# Patient Record
Sex: Female | Born: 1945 | Race: White | Hispanic: No | Marital: Single | Smoking: Current every day smoker
Health system: Southern US, Community
[De-identification: ages and names within clinical notes are randomized; demographics above are authoritative.]

## PROBLEM LIST (undated history)

## (undated) DIAGNOSIS — E876 Hypokalemia: Secondary | ICD-10-CM

## (undated) DIAGNOSIS — S7291XA Unspecified fracture of right femur, initial encounter for closed fracture: Secondary | ICD-10-CM

## (undated) DIAGNOSIS — S72402A Unspecified fracture of lower end of left femur, initial encounter for closed fracture: Secondary | ICD-10-CM

## (undated) DIAGNOSIS — M810 Age-related osteoporosis without current pathological fracture: Secondary | ICD-10-CM

## (undated) DIAGNOSIS — S72462A Displaced supracondylar fracture with intracondylar extension of lower end of left femur, initial encounter for closed fracture: Secondary | ICD-10-CM

## (undated) HISTORY — DX: Displaced supracondylar fracture with intracondylar extension of lower end of left femur, initial encounter for closed fracture: S72.462A

## (undated) HISTORY — DX: Unspecified fracture of right femur, initial encounter for closed fracture: S72.91XA

## (undated) HISTORY — DX: Hypokalemia: E87.6

## (undated) HISTORY — DX: Hypomagnesemia: E83.42

## (undated) HISTORY — DX: Unspecified fracture of lower end of left femur, initial encounter for closed fracture: S72.402A

## (undated) HISTORY — DX: Age-related osteoporosis without current pathological fracture: M81.0

---

## 2004-10-31 ENCOUNTER — Other Ambulatory Visit: Admission: RE | Admit: 2004-10-31 | Discharge: 2004-10-31 | Payer: Self-pay | Admitting: Family Medicine

## 2004-11-21 ENCOUNTER — Encounter: Admission: RE | Admit: 2004-11-21 | Discharge: 2004-11-21 | Payer: Self-pay | Admitting: Family Medicine

## 2005-06-29 ENCOUNTER — Emergency Department (HOSPITAL_COMMUNITY): Admission: EM | Admit: 2005-06-29 | Discharge: 2005-06-29 | Payer: Self-pay | Admitting: Emergency Medicine

## 2006-01-02 ENCOUNTER — Encounter: Admission: RE | Admit: 2006-01-02 | Discharge: 2006-01-02 | Payer: Self-pay | Admitting: Family Medicine

## 2006-02-20 ENCOUNTER — Other Ambulatory Visit: Admission: RE | Admit: 2006-02-20 | Discharge: 2006-02-20 | Payer: Self-pay | Admitting: Family Medicine

## 2006-09-26 ENCOUNTER — Encounter (INDEPENDENT_AMBULATORY_CARE_PROVIDER_SITE_OTHER): Payer: Self-pay | Admitting: Gastroenterology

## 2006-09-26 ENCOUNTER — Ambulatory Visit (HOSPITAL_COMMUNITY): Admission: RE | Admit: 2006-09-26 | Discharge: 2006-09-26 | Payer: Self-pay | Admitting: Gastroenterology

## 2007-01-09 ENCOUNTER — Encounter: Admission: RE | Admit: 2007-01-09 | Discharge: 2007-01-09 | Payer: Self-pay | Admitting: Family Medicine

## 2007-10-30 ENCOUNTER — Other Ambulatory Visit: Admission: RE | Admit: 2007-10-30 | Discharge: 2007-10-30 | Payer: Self-pay | Admitting: Family Medicine

## 2008-01-12 ENCOUNTER — Encounter: Admission: RE | Admit: 2008-01-12 | Discharge: 2008-01-12 | Payer: Self-pay | Admitting: Family Medicine

## 2008-01-14 ENCOUNTER — Encounter: Admission: RE | Admit: 2008-01-14 | Discharge: 2008-01-14 | Payer: Self-pay | Admitting: Family Medicine

## 2008-12-09 ENCOUNTER — Observation Stay (HOSPITAL_COMMUNITY): Admission: AD | Admit: 2008-12-09 | Discharge: 2008-12-10 | Payer: Self-pay | Admitting: Gastroenterology

## 2008-12-10 ENCOUNTER — Encounter (INDEPENDENT_AMBULATORY_CARE_PROVIDER_SITE_OTHER): Payer: Self-pay | Admitting: Gastroenterology

## 2009-01-14 ENCOUNTER — Encounter: Admission: RE | Admit: 2009-01-14 | Discharge: 2009-01-14 | Payer: Self-pay | Admitting: Family Medicine

## 2009-01-21 ENCOUNTER — Encounter: Admission: RE | Admit: 2009-01-21 | Discharge: 2009-01-21 | Payer: Self-pay | Admitting: Family Medicine

## 2010-01-17 ENCOUNTER — Encounter: Admission: RE | Admit: 2010-01-17 | Discharge: 2010-01-17 | Payer: Self-pay | Admitting: Family Medicine

## 2010-06-23 LAB — BASIC METABOLIC PANEL
CO2: 24 mEq/L (ref 19–32)
CO2: 25 mEq/L (ref 19–32)
Calcium: 8.3 mg/dL — ABNORMAL LOW (ref 8.4–10.5)
Calcium: 9 mg/dL (ref 8.4–10.5)
Chloride: 103 mEq/L (ref 96–112)
Creatinine, Ser: 0.51 mg/dL (ref 0.4–1.2)
Creatinine, Ser: 0.52 mg/dL (ref 0.4–1.2)
GFR calc Af Amer: >60 mL/min (ref >60)
GFR calc non Af Amer: >60 mL/min (ref >60)
Glucose, Bld: 93 mg/dL (ref 70–99)
Sodium: 137 mEq/L (ref 135–145)
Sodium: 138 mEq/L (ref 135–145)

## 2010-06-23 LAB — CBC
HCT: 29.1 % — ABNORMAL LOW (ref 36.0–46.0)
HCT: 31 % — ABNORMAL LOW (ref 36.0–46.0)
Hemoglobin: 10.8 g/dL — ABNORMAL LOW (ref 12.0–15.0)
Hemoglobin: 11.3 g/dL — ABNORMAL LOW (ref 12.0–15.0)
Hemoglobin: 9.6 g/dL — ABNORMAL LOW (ref 12.0–15.0)
Hemoglobin: 9.8 g/dL — ABNORMAL LOW (ref 12.0–15.0)
MCHC: 33.1 g/dL (ref 30.0–36.0)
MCHC: 33.9 g/dL (ref 30.0–36.0)
MCHC: 33.9 g/dL (ref 30.0–36.0)
MCHC: 34.3 g/dL (ref 30.0–36.0)
MCV: 103.7 fL — ABNORMAL HIGH (ref 78.0–100.0)
MCV: 103.9 fL — ABNORMAL HIGH (ref 78.0–100.0)
MCV: 104.4 fL — ABNORMAL HIGH (ref 78.0–100.0)
MCV: 105 fL — ABNORMAL HIGH (ref 78.0–100.0)
Platelets: 229 10*3/uL (ref 150–400)
Platelets: 251 10*3/uL (ref 150–400)
RBC: 2.64 MIL/uL — ABNORMAL LOW (ref 3.87–5.11)
RBC: 2.77 MIL/uL — ABNORMAL LOW (ref 3.87–5.11)
RBC: 2.78 MIL/uL — ABNORMAL LOW (ref 3.87–5.11)
RDW: 13 % (ref 11.5–15.5)
RDW: 13.2 % (ref 11.5–15.5)
WBC: 11.1 10*3/uL — ABNORMAL HIGH (ref 4.0–10.5)
WBC: 9.7 10*3/uL (ref 4.0–10.5)

## 2010-08-01 NOTE — Op Note (Signed)
[name], [name]                ACCOUNT NO.:  [id number]   MEDICAL RECORD NO.:  [id number]          PATIENT TYPE:  AMB   LOCATION:  ENDO                         FACILITY:  [facility]   PHYSICIAN:  John C. Madilyn Fireman, M.D.    DATE OF BIRTH:  [birth year]   DATE OF PROCEDURE:  09/26/2006  DATE OF DISCHARGE:                               OPERATIVE REPORT   PROCEDURE:  Colonoscopy with polypectomy.   ENDOSCOPIST:  Everardo All. Madilyn Fireman, M.D.   INDICATIONS FOR PROCEDURE:  Broad adenomatous colon polyp with last  endoscopic resection felt possibly to be incomplete.   PROCEDURE:  The patient placed was placed in the left lateral decubitus  position and placed on pulse monitor with continuous low-flow oxygen  delivered by nasal cannula.  She was sedated with 125 mcg of IV fentanyl  and 12 mg of IV Versed.  The Olympus video colonoscope was inserted into  the rectum and advanced to cecum, confirmed by transillumination of  McBurney's point and visualization of the ileocecal valve and  appendiceal orifice.  The prep was excellent, although there was a lot  of water that had to be suctioned away to allow adequate visualization;  also her colon was very floppy and mobile.  Approximately 10-15 minutes  were spent examining the cecum and the ascending colon.  I saw only 2  small ascending colon polyps, 1 approximately 6 mm in diameter, the  other one 4 mm in diameter.  I could not clearly identify scar tissue  from previous polypectomy and was concerned that these may not have  addressed the original site; however, I carefully examined all the  mucosa and found no other lesions seen in the ascending colon.  The 2  small polyps were retrieved for histology.  The remainder of the cecum,  ascending, transverse, descending and sigmoid colon as well as the  rectum all appeared normal with no further polyps, masses or  diverticula.  The scope was then withdrawn and the patient returned to  the recovery room in stable  condition.  She tolerated the procedure well  and there were no immediate complications.   IMPRESSION:  Two small polyps with no evidence of broadly-attached or  wide-ranging polyp as seen on the last procedure, hopefully indicating  that it was nearly destroyed by a combination of snare and hot biopsy  technique.   PLAN:  Await histology and will probably repeat colonoscopy in a year to  a year and a half.           ______________________________  Everardo All. Madilyn Fireman, M.D.     J[facility]/MEDQ  D:  09/26/2006  T:  09/27/2006  Job:  [id number]   cc:   Sigmund Hazel, M.D.  Fax: [phone]

## 2011-01-17 ENCOUNTER — Other Ambulatory Visit: Payer: Self-pay | Admitting: Gastroenterology

## 2011-02-01 ENCOUNTER — Other Ambulatory Visit: Payer: Self-pay | Admitting: Family Medicine

## 2011-02-01 DIAGNOSIS — Z1231 Encounter for screening mammogram for malignant neoplasm of breast: Secondary | ICD-10-CM

## 2011-02-26 ENCOUNTER — Ambulatory Visit
Admission: RE | Admit: 2011-02-26 | Discharge: 2011-02-26 | Disposition: A | Discharge status: Home / Self Care | Payer: Medicare Other | Source: Ambulatory Visit | Attending: Family Medicine | Admitting: Family Medicine

## 2011-02-26 DIAGNOSIS — Z1231 Encounter for screening mammogram for malignant neoplasm of breast: Secondary | ICD-10-CM

## 2011-09-04 ENCOUNTER — Other Ambulatory Visit: Payer: Self-pay | Admitting: Family Medicine

## 2011-09-04 ENCOUNTER — Ambulatory Visit
Admission: RE | Admit: 2011-09-04 | Discharge: 2011-09-04 | Disposition: A | Discharge status: Home / Self Care | Payer: Medicare Other | Source: Ambulatory Visit | Attending: Family Medicine | Admitting: Family Medicine

## 2011-09-04 DIAGNOSIS — R918 Other nonspecific abnormal finding of lung field: Secondary | ICD-10-CM

## 2012-02-05 ENCOUNTER — Other Ambulatory Visit: Payer: Self-pay | Admitting: Family Medicine

## 2012-02-05 DIAGNOSIS — Z1231 Encounter for screening mammogram for malignant neoplasm of breast: Secondary | ICD-10-CM

## 2012-03-18 ENCOUNTER — Ambulatory Visit
Admission: RE | Admit: 2012-03-18 | Discharge: 2012-03-18 | Disposition: A | Discharge status: Home / Self Care | Payer: Medicare Other | Source: Ambulatory Visit | Attending: Family Medicine | Admitting: Family Medicine

## 2012-03-18 DIAGNOSIS — Z1231 Encounter for screening mammogram for malignant neoplasm of breast: Secondary | ICD-10-CM

## 2013-02-16 ENCOUNTER — Other Ambulatory Visit: Payer: Self-pay

## 2013-02-16 DIAGNOSIS — Z1231 Encounter for screening mammogram for malignant neoplasm of breast: Secondary | ICD-10-CM

## 2013-03-20 ENCOUNTER — Ambulatory Visit
Admission: RE | Admit: 2013-03-20 | Discharge: 2013-03-20 | Disposition: A | Discharge status: Home / Self Care | Payer: Medicare Other | Source: Ambulatory Visit

## 2013-03-20 ENCOUNTER — Other Ambulatory Visit: Payer: Self-pay

## 2013-03-20 DIAGNOSIS — Z1231 Encounter for screening mammogram for malignant neoplasm of breast: Secondary | ICD-10-CM

## 2014-07-05 ENCOUNTER — Other Ambulatory Visit: Payer: Self-pay

## 2014-07-05 DIAGNOSIS — Z1231 Encounter for screening mammogram for malignant neoplasm of breast: Secondary | ICD-10-CM

## 2014-07-08 ENCOUNTER — Ambulatory Visit
Admission: RE | Admit: 2014-07-08 | Discharge: 2014-07-08 | Disposition: A | Discharge status: Home / Self Care | Payer: Medicare Other | Source: Ambulatory Visit

## 2014-07-08 DIAGNOSIS — Z1231 Encounter for screening mammogram for malignant neoplasm of breast: Secondary | ICD-10-CM

## 2015-07-22 ENCOUNTER — Other Ambulatory Visit: Payer: Self-pay

## 2015-07-22 DIAGNOSIS — Z1231 Encounter for screening mammogram for malignant neoplasm of breast: Secondary | ICD-10-CM

## 2015-08-11 ENCOUNTER — Ambulatory Visit
Admission: RE | Admit: 2015-08-11 | Discharge: 2015-08-11 | Disposition: A | Discharge status: Home / Self Care | Payer: Medicare Other | Source: Ambulatory Visit

## 2015-08-11 DIAGNOSIS — Z1231 Encounter for screening mammogram for malignant neoplasm of breast: Secondary | ICD-10-CM

## 2015-08-16 ENCOUNTER — Other Ambulatory Visit: Payer: Self-pay | Admitting: Family Medicine

## 2015-08-16 DIAGNOSIS — R928 Other abnormal and inconclusive findings on diagnostic imaging of breast: Secondary | ICD-10-CM

## 2015-08-24 ENCOUNTER — Other Ambulatory Visit: Payer: Self-pay | Admitting: Family Medicine

## 2015-08-24 DIAGNOSIS — R928 Other abnormal and inconclusive findings on diagnostic imaging of breast: Secondary | ICD-10-CM

## 2015-08-25 ENCOUNTER — Ambulatory Visit
Admission: RE | Admit: 2015-08-25 | Discharge: 2015-08-25 | Disposition: A | Discharge status: Home / Self Care | Payer: Medicare Other | Source: Ambulatory Visit | Attending: Family Medicine | Admitting: Family Medicine

## 2015-08-25 ENCOUNTER — Other Ambulatory Visit: Payer: Self-pay | Admitting: Family Medicine

## 2015-08-25 DIAGNOSIS — R928 Other abnormal and inconclusive findings on diagnostic imaging of breast: Secondary | ICD-10-CM

## 2015-08-25 DIAGNOSIS — N6320 Unspecified lump in the left breast, unspecified quadrant: Secondary | ICD-10-CM

## 2015-08-31 ENCOUNTER — Ambulatory Visit
Admission: RE | Admit: 2015-08-31 | Discharge: 2015-08-31 | Disposition: A | Discharge status: Home / Self Care | Payer: Medicare Other | Source: Ambulatory Visit | Attending: Family Medicine | Admitting: Family Medicine

## 2015-08-31 ENCOUNTER — Other Ambulatory Visit: Payer: Self-pay | Admitting: Family Medicine

## 2015-08-31 DIAGNOSIS — N6320 Unspecified lump in the left breast, unspecified quadrant: Secondary | ICD-10-CM

## 2015-12-31 ENCOUNTER — Inpatient Hospital Stay (HOSPITAL_COMMUNITY)
Admission: EM | Admit: 2015-12-31 | Discharge: 2016-01-06 | DRG: 482 | Disposition: A | Discharge status: Skilled Nursing Facility | Payer: Medicare Other | Attending: Orthopedic Surgery | Admitting: Orthopedic Surgery

## 2015-12-31 ENCOUNTER — Encounter (HOSPITAL_COMMUNITY): Payer: Self-pay | Admitting: Emergency Medicine

## 2015-12-31 ENCOUNTER — Emergency Department (HOSPITAL_COMMUNITY): Payer: Medicare Other

## 2015-12-31 DIAGNOSIS — W108XXA Fall (on) (from) other stairs and steps, initial encounter: Secondary | ICD-10-CM | POA: Diagnosis present

## 2015-12-31 DIAGNOSIS — S7291XS Unspecified fracture of right femur, sequela: Secondary | ICD-10-CM | POA: Diagnosis not present

## 2015-12-31 DIAGNOSIS — F17200 Nicotine dependence, unspecified, uncomplicated: Secondary | ICD-10-CM | POA: Diagnosis present

## 2015-12-31 DIAGNOSIS — S72402A Unspecified fracture of lower end of left femur, initial encounter for closed fracture: Secondary | ICD-10-CM

## 2015-12-31 DIAGNOSIS — S7291XA Unspecified fracture of right femur, initial encounter for closed fracture: Secondary | ICD-10-CM | POA: Diagnosis present

## 2015-12-31 DIAGNOSIS — R9431 Abnormal electrocardiogram [ECG] [EKG]: Secondary | ICD-10-CM | POA: Diagnosis present

## 2015-12-31 DIAGNOSIS — M25569 Pain in unspecified knee: Secondary | ICD-10-CM | POA: Diagnosis present

## 2015-12-31 DIAGNOSIS — Z7983 Long term (current) use of bisphosphonates: Secondary | ICD-10-CM | POA: Diagnosis not present

## 2015-12-31 DIAGNOSIS — M810 Age-related osteoporosis without current pathological fracture: Secondary | ICD-10-CM | POA: Diagnosis present

## 2015-12-31 DIAGNOSIS — Z09 Encounter for follow-up examination after completed treatment for conditions other than malignant neoplasm: Secondary | ICD-10-CM

## 2015-12-31 DIAGNOSIS — E876 Hypokalemia: Secondary | ICD-10-CM | POA: Diagnosis present

## 2015-12-31 DIAGNOSIS — Y92019 Unspecified place in single-family (private) house as the place of occurrence of the external cause: Secondary | ICD-10-CM

## 2015-12-31 DIAGNOSIS — Z01811 Encounter for preprocedural respiratory examination: Secondary | ICD-10-CM

## 2015-12-31 DIAGNOSIS — Z419 Encounter for procedure for purposes other than remedying health state, unspecified: Secondary | ICD-10-CM

## 2015-12-31 DIAGNOSIS — Y9301 Activity, walking, marching and hiking: Secondary | ICD-10-CM | POA: Diagnosis present

## 2015-12-31 DIAGNOSIS — S72462A Displaced supracondylar fracture with intracondylar extension of lower end of left femur, initial encounter for closed fracture: Secondary | ICD-10-CM | POA: Diagnosis present

## 2015-12-31 LAB — PROTIME-INR
INR: 0.9
Prothrombin Time: 12.1 seconds (ref 11.4–15.2)

## 2015-12-31 LAB — CBC WITH DIFFERENTIAL/PLATELET
Basophils Absolute: 0 K/uL (ref 0.0–0.1)
Basophils Relative: 0 %
Eosinophils Absolute: 0 K/uL (ref 0.0–0.7)
Eosinophils Relative: 0 %
HCT: 39.2 % (ref 36.0–46.0)
Hemoglobin: 13.6 g/dL (ref 12.0–15.0)
Lymphocytes Relative: 13 %
Lymphs Abs: 1.1 K/uL (ref 0.7–4.0)
MCH: 34.7 pg — ABNORMAL HIGH (ref 26.0–34.0)
MCHC: 34.7 g/dL (ref 30.0–36.0)
MCV: 100 fL (ref 78.0–100.0)
Monocytes Absolute: 0.5 K/uL (ref 0.1–1.0)
Monocytes Relative: 6 %
Neutro Abs: 7.1 K/uL (ref 1.7–7.7)
Neutrophils Relative %: 81 %
Platelets: 290 K/uL (ref 150–400)
RBC: 3.92 MIL/uL (ref 3.87–5.11)
RDW: 12.8 % (ref 11.5–15.5)
WBC: 8.8 K/uL (ref 4.0–10.5)

## 2015-12-31 LAB — URINALYSIS, ROUTINE W REFLEX MICROSCOPIC
GLUCOSE, UA: NEGATIVE mg/dL
Hgb urine dipstick: NEGATIVE
Ketones, ur: 15 mg/dL — AB
LEUKOCYTES UA: NEGATIVE
NITRITE: NEGATIVE
PH: 6 (ref 5.0–8.0)
Protein, ur: NEGATIVE mg/dL
SPECIFIC GRAVITY, URINE: 1.022 (ref 1.005–1.030)

## 2015-12-31 LAB — BASIC METABOLIC PANEL WITH GFR
Anion gap: 10 (ref 5–15)
BUN: 12 mg/dL (ref 6–20)
CO2: 20 mmol/L — ABNORMAL LOW (ref 22–32)
Calcium: 9.6 mg/dL (ref 8.9–10.3)
Chloride: 106 mmol/L (ref 101–111)
Creatinine, Ser: 0.62 mg/dL (ref 0.44–1.00)
GFR calc Af Amer: >60 mL/min
GFR calc non Af Amer: >60 mL/min
Glucose, Bld: 99 mg/dL (ref 65–99)
Potassium: 2.9 mmol/L — ABNORMAL LOW (ref 3.5–5.1)
Sodium: 136 mmol/L (ref 135–145)

## 2015-12-31 MED ORDER — MIDAZOLAM HCL 2 MG/2ML IJ SOLN
INTRAMUSCULAR | Status: AC | PRN
  Administered 2015-12-31: 0.5 mg via INTRAVENOUS

## 2015-12-31 MED ORDER — METHOCARBAMOL 500 MG PO TABS
500.0000 mg | ORAL_TABLET | Freq: Four times a day (QID) | ORAL | Status: DC | PRN
Start: 2015-12-31 — End: 2016-01-04
  Administered 2016-01-01 – 2016-01-04 (×4): 500 mg via ORAL
  Filled 2015-12-31 (×4): qty 1

## 2015-12-31 MED ORDER — FENTANYL CITRATE (PF) 100 MCG/2ML IJ SOLN
50.0000 ug | Freq: Once | INTRAMUSCULAR | Status: AC
  Administered 2015-12-31: 50 ug via INTRAVENOUS
  Filled 2015-12-31: qty 2

## 2015-12-31 MED ORDER — ENOXAPARIN SODIUM 40 MG/0.4ML ~~LOC~~ SOLN
40.0000 mg | SUBCUTANEOUS | Status: DC
  Administered 2015-12-31: 40 mg via SUBCUTANEOUS
  Filled 2015-12-31: qty 0.4

## 2015-12-31 MED ORDER — HYDROMORPHONE HCL 1 MG/ML IJ SOLN
1.0000 mg | Freq: Once | INTRAMUSCULAR | Status: AC
  Administered 2015-12-31: 1 mg via INTRAVENOUS
  Filled 2015-12-31: qty 1

## 2015-12-31 MED ORDER — ETOMIDATE 2 MG/ML IV SOLN
10.0000 mg | Freq: Once | INTRAVENOUS | Status: DC
  Filled 2015-12-31: qty 10

## 2015-12-31 MED ORDER — METHOCARBAMOL 1000 MG/10ML IJ SOLN
500.0000 mg | Freq: Four times a day (QID) | INTRAVENOUS | Status: DC | PRN
  Filled 2015-12-31: qty 5

## 2015-12-31 MED ORDER — MIDAZOLAM HCL 2 MG/2ML IJ SOLN
INTRAMUSCULAR | Status: AC
  Filled 2015-12-31: qty 2

## 2015-12-31 MED ORDER — OXYCODONE HCL 5 MG PO TABS
5.0000 mg | ORAL_TABLET | ORAL | Status: DC | PRN
  Administered 2016-01-01 (×2): 10 mg via ORAL
  Administered 2016-01-03: 5 mg via ORAL
  Administered 2016-01-04: 10 mg via ORAL
  Administered 2016-01-04 (×2): 5 mg via ORAL
  Administered 2016-01-05 (×2): 10 mg via ORAL
  Filled 2015-12-31 (×2): qty 2
  Filled 2015-12-31: qty 1
  Filled 2015-12-31: qty 2
  Filled 2015-12-31: qty 1
  Filled 2015-12-31 (×4): qty 2

## 2015-12-31 MED ORDER — POTASSIUM CHLORIDE 10 MEQ/100ML IV SOLN
10.0000 meq | Freq: Once | INTRAVENOUS | Status: AC
  Administered 2015-12-31: 10 meq via INTRAVENOUS
  Filled 2015-12-31: qty 100

## 2015-12-31 MED ORDER — OXYCODONE-ACETAMINOPHEN 5-325 MG PO TABS
1.0000 | ORAL_TABLET | Freq: Once | ORAL | Status: AC
  Administered 2015-12-31: 1 via ORAL
  Filled 2015-12-31: qty 1

## 2015-12-31 MED ORDER — HYDROMORPHONE HCL 1 MG/ML IJ SOLN
0.5000 mg | INTRAMUSCULAR | Status: DC | PRN
  Administered 2015-12-31 – 2016-01-02 (×5): 0.5 mg via INTRAVENOUS
  Filled 2015-12-31 (×5): qty 1

## 2015-12-31 MED ORDER — ETOMIDATE 2 MG/ML IV SOLN
INTRAVENOUS | Status: AC | PRN
  Administered 2015-12-31: 10 mg via INTRAVENOUS

## 2015-12-31 MED ORDER — MIDAZOLAM HCL 2 MG/2ML IJ SOLN: 0.5000 mg | Freq: Once | INTRAMUSCULAR | Status: DC

## 2015-12-31 MED ORDER — LACTATED RINGERS IV SOLN
INTRAVENOUS | Status: DC
  Administered 2015-12-31 – 2016-01-03 (×5): via INTRAVENOUS

## 2015-12-31 NOTE — Consult Note (Signed)
History and Physical    [name] ZOX:[id number] DOB: 1946/01/12 DOA: 12/31/2015  PCP: Carson Myrtle, MD  Patient coming from: home  Chief Complaint:   Fall Reason for consult:  Pre op evaluation Consulting physician:  Dr. Shon Baton orthopedic surgeon  HPI: [name] is a 70 y.o. female with medical history significant of osteoporosis comes in after slipping on her stairs walking down and hurt her left leg.  Pt is overall very healthy.  Sees her PCP regularly twice a year.  There was no LOC during fall no other injury.  Found to have a distal left femur fracture.  Dr Shon Baton was called with ortho team who has just reduced the fracture and called for medical consult for preop evaluation, he plans to take to OR Monday.  Review of Systems: As per HPI otherwise 10 point review of systems negative.   History reviewed. No pertinent past medical history.  History reviewed. No pertinent surgical history.   reports that she has been smoking.  She does not have any smokeless tobacco history on file. She reports that she does not drink alcohol. Her drug history is not on file. neg x 3  No Known Allergies  History reviewed. No pertinent family history.  Prior to Admission medications   Medication Sig Start Date End Date Taking? Authorizing Provider  alendronate (FOSAMAX) 70 MG tablet Take 70 mg by mouth once a week. Sundays 11/22/15  Yes Historical Provider, MD  Multiple Vitamins-Minerals (MULTIVITAMIN ADULT PO) Take 1 tablet by mouth daily.    Yes Historical Provider, MD    Physical Exam: Vitals:   12/31/15 1933 12/31/15 1946 12/31/15 1951 12/31/15 2000  BP: 144/80 148/83 134/87 136/83  Pulse: 98 89 84 83  Resp: 17 19 18 16   Temp:      TempSrc:      SpO2: 95% 95% 100% 99%      Constitutional: NAD, calm, comfortable Vitals:   12/31/15 1933 12/31/15 1946 12/31/15 1951 12/31/15 2000  BP: 144/80 148/83 134/87 136/83  Pulse: 98 89 84 83  Resp: 17 19 18 16   Temp:      TempSrc:       SpO2: 95% 95% 100% 99%   Eyes: PERRL, lids and conjunctivae normal ENMT: Mucous membranes are moist. Posterior pharynx clear of any exudate or lesions.Normal dentition.  Neck: normal, supple, no masses, no thyromegaly Respiratory: clear to auscultation bilaterally, no wheezing, no crackles. Normal respiratory effort. No accessory muscle use.  Cardiovascular: Regular rate and rhythm, no murmurs / rubs / gallops. No extremity edema. 2+ pedal pulses. No carotid bruits.  Abdomen: no tenderness, no masses palpated. No hepatosplenomegaly. Bowel sounds positive.  Musculoskeletal: no clubbing / cyanosis. No joint deformity upper and lower extremities. Good ROM, no contractures. Normal muscle tone.  lle in brace, pulse intact Skin: no rashes, lesions, ulcers. No induration Neurologic: CN 2-12 grossly intact. Sensation intact, DTR normal. Strength 5/5 in all 4.  Psychiatric: Normal judgment and insight. Alert and oriented x 3. Normal mood.    Labs on Admission: I have personally reviewed following labs and imaging studies  CBC:  Recent Labs Lab 12/31/15 1625  WBC 8.8  NEUTROABS 7.1  HGB 13.6  HCT 39.2  MCV 100.0  PLT 290   Basic Metabolic Panel:  Recent Labs Lab 12/31/15 1625  NA 136  K 2.9*  CL 106  CO2 20*  GLUCOSE 99  BUN 12  CREATININE 0.62  CALCIUM 9.6   GFR: CrCl cannot be calculated (  Unknown ideal weight.).  Coagulation Profile:  Recent Labs Lab 12/31/15 1625  INR 0.90     Radiological Exams on Admission: Ct Knee Left Wo Contrast  Result Date: 12/31/2015 CLINICAL DATA:  Evaluate left knee fracture.  Initial encounter. EXAM: CT OF THE LEFT KNEE WITHOUT CONTRAST TECHNIQUE: Multidetector CT imaging of the left knee was performed according to the standard protocol. Multiplanar CT image reconstructions were also generated. COMPARISON:  Left knee radiographs performed earlier today at 3:11 p.m. FINDINGS: Bones/Joint/Cartilage There is a comminuted and displaced  fracture of the distal femur, with a large posteriorly, proximally and laterally displaced lateral condylar fragment, and an additional mildly displaced fracture line extending across the medial femoral metadiaphysis. The larger medial condylar fragment demonstrates mild posterior displacement and angulation. No additional fractures are seen. There is diffuse chondrocalcinosis at the joint space, and moderate hemarthrosis is noted, with blood tracking throughout Hoffa's fat pad. The cartilage is not well assessed on CT. Ligaments The posterior cruciate ligament appears intact. The anterior cruciate ligament is not well assessed on CT. The medial collateral ligament and lateral collateral ligament complex are difficult to fully assess. Muscles and Tendons The visualized musculature is grossly unremarkable. The quadriceps and patellar tendons remain intact. Soft tissues The vasculature is not well assessed. No additional soft tissue abnormalities are seen. IMPRESSION: 1. Comminuted and displaced fracture of the distal femur, with a large posteriorly, proximally and laterally displaced lateral condylar fragment, and additional mildly displaced fracture line extending across the medial femoral metadiaphysis. The larger medial condylar fragment demonstrates mild posterior displacement and angulation. 2. Moderate hemarthrosis, with blood tracking throughout Hoffa's fat pad. 3. Diffuse chondrocalcinosis at the joint space. Electronically Signed   By: Roanna Raider M.D.   On: 12/31/2015 18:46   Dg Knee Complete 4 Views Left  Result Date: 12/31/2015 CLINICAL DATA:  Pt c/o severe knee pain today w/ obv deformity s/p slipped on bottom two steps at her son's house today, landing on knee. EXAM: LEFT KNEE - COMPLETE 4+ VIEW COMPARISON:  None. FINDINGS: Significantly displaced fracture of the distal left femur, with avulsion of a large portion of the lateral femoral condyle, with probable additional impaction at the fracture  site. Suspect additional minimally displaced fracture within the medial femoral condyle. No obvious fracture seen within the proximal tibia or fibula. IMPRESSION: 1. Significantly displaced fracture of the distal left femur, with avulsion and rotation of a large portion of the lateral femoral condyle, with probable additional impaction at the fracture site. 2. Suspect additional minimally displaced fracture within the medial femoral condyle. Electronically Signed   By: Bary Richard M.D.   On: 12/31/2015 15:37    EKG: Independently reviewed. nsr q waves noted in anterior leads, no acute issues  Assessment/Plan 70 yo healthy female with distal right femur fracture  Principal Problem:   Hypokalemia- replete k level orally, add on magnesium level and correct as needed prior to surgery.  Active Problems:   Femur fracture, right (HCC)- anticoagulants per ortho team   Osteoporosis- noted, hold weekly fosamax   Abnormal EKG - will obtain cardiac echo prior to OR since she is going on Monday   DVT prophylaxis:  Per ortho team Code Status:   full Family Communication: son at bedside Disposition Plan:  Per ortho team, may need rehab post op depending on how she does, lives alone Consults called:  none Admission status:  Admit by ortho team  Please call us with any further questions, we will follow along  with you daily to optimize her perioperative course.  Thank you and have a good day.   DAVID,RACHAL A MD [facility]  If 7PM-7AM, please contact night-coverage [email or web address] Password [email or web address]  12/31/2015, 8:32 PM

## 2015-12-31 NOTE — ED Notes (Signed)
Blood delayed due to staff is doing a conscious sedation at this time

## 2015-12-31 NOTE — ED Provider Notes (Addendum)
[facility]-EMERGENCY DEPT Provider Note   CSN: [id number] Arrival date & time: 12/31/15  1429     History   Chief Complaint Chief Complaint  Patient presents with  . Fall  . Knee Pain    HPI [name] is a 70 y.o. female with no significant pmhx who presents to the ED today to be evaluated after a mechanical fall. Pt states that she was walking down the stairs when her foot slipped on the 2nd to last stair and she fell forward landing on her right knee. She denies and head injury, LOC, other trauma. Pt has been unable to straighten left leg or ambulate since accident. Injury occurred approximately 6 hours ago. Pt does not take blood thinners.   HPI  History reviewed. No pertinent past medical history.  There are no active problems to display for this patient.   History reviewed. No pertinent surgical history.  OB History    No data available       Home Medications    Prior to Admission medications   Medication Sig Start Date End Date Taking? Authorizing Provider  alendronate (FOSAMAX) 70 MG tablet Take 70 mg by mouth once a week.  11/22/15   Historical Provider, MD    Family History History reviewed. No pertinent family history.  Social History Social History  Substance Use Topics  . Smoking status: Current Every Day Smoker  . Smokeless tobacco: Not on file  . Alcohol use No     Allergies   Review of patient's allergies indicates no known allergies.   Review of Systems Review of Systems  All other systems reviewed and are negative.    Physical Exam Updated Vital Signs BP 105/75 (BP Location: Left Arm)   Pulse 89   Temp 97.8 F (36.6 C) (Oral)   Resp 18   SpO2 100%   Physical Exam  Constitutional: She is oriented to person, place, and time. She appears well-developed and well-nourished. No distress.  HENT:  Head: Normocephalic and atraumatic.  Eyes: Conjunctivae are normal. Right eye exhibits no discharge. Left eye exhibits no discharge. No  scleral icterus.  Cardiovascular: Normal rate and intact distal pulses.   Pulmonary/Chest: Effort normal.  Musculoskeletal:  Significant swelling of left knee, flexed in 45 degree position with lateral rotation, unable to extend fully due to pain. Severe TTP.   Neurological: She is alert and oriented to person, place, and time. Coordination normal.  Skin: Skin is warm and dry. No rash noted. She is not diaphoretic. No erythema. No pallor.  Psychiatric: She has a normal mood and affect. Her behavior is normal.  Nursing note and vitals reviewed.    ED Treatments / Results  Labs (all labs ordered are listed, but only abnormal results are displayed) Labs Reviewed  BASIC METABOLIC PANEL  CBC WITH DIFFERENTIAL/PLATELET    EKG  EKG Interpretation None       Radiology Dg Knee Complete 4 Views Left  Result Date: 12/31/2015 CLINICAL DATA:  Pt c/o severe knee pain today w/ obv deformity s/p slipped on bottom two steps at her son's house today, landing on knee. EXAM: LEFT KNEE - COMPLETE 4+ VIEW COMPARISON:  None. FINDINGS: Significantly displaced fracture of the distal left femur, with avulsion of a large portion of the lateral femoral condyle, with probable additional impaction at the fracture site. Suspect additional minimally displaced fracture within the medial femoral condyle. No obvious fracture seen within the proximal tibia or fibula. IMPRESSION: 1. Significantly displaced fracture of the distal  left femur, with avulsion and rotation of a large portion of the lateral femoral condyle, with probable additional impaction at the fracture site. 2. Suspect additional minimally displaced fracture within the medial femoral condyle. Electronically Signed   By: Bary Richard M.D.   On: 12/31/2015 15:37    Procedures .Sedation Date/Time: 12/31/2015 8:29 PM Performed by: Gaylyn Rong TRIPP Authorized by: Gaylyn Rong TRIPP   Consent:    Consent obtained:  Verbal   Consent given  by:  Patient   Risks discussed:  Allergic reaction, respiratory compromise necessitating ventilatory assistance and intubation and inadequate sedation   Alternatives discussed:  Analgesia without sedation Indications:    Procedure performed:  Fracture reduction   Procedure necessitating sedation performed by:  Physician performing sedation   Intended level of sedation:  Moderate (conscious sedation) Pre-sedation assessment:    ASA classification: class 1 - normal, healthy patient     Neck mobility: normal     Mouth opening:  3 or more finger widths   Pre-sedation assessments completed and reviewed: airway patency, mental status, pain level and respiratory function     History of difficult intubation: no   Immediate pre-procedure details:    Reassessment: Patient reassessed immediately prior to procedure     Reviewed: vital signs and NPO status     Verified: emergency equipment available, IV patency confirmed, oxygen available and suction available   Procedure details (see MAR for exact dosages):    Preoxygenation:  Room air   Sedation:  Etomidate (and versed)   Analgesia:  Hydromorphone   Intra-procedure monitoring:  Blood pressure monitoring, cardiac monitor, continuous capnometry, continuous pulse oximetry, frequent vital sign checks and frequent LOC assessments   Intra-procedure events: hypoxia     Intra-procedure management:  Airway repositioning and supplemental oxygen Post-procedure details:    Attendance: Constant attendance by certified staff until patient recovered     Recovery: Patient returned to pre-procedure baseline     Post-sedation assessments completed and reviewed: airway patency, cardiovascular function, mental status, nausea/vomiting, pain level and temperature     Patient is stable for discharge or admission: yes     Patient tolerance:  Tolerated well, no immediate complications Reduction of fracture Date/Time: 12/31/2015 8:33 PM Performed by: Gaylyn Rong  TRIPP Authorized by: Gaylyn Rong TRIPP  Consent: Verbal consent obtained. Risks and benefits: risks, benefits and alternatives were discussed Consent given by: patient Patient understanding: patient states understanding of the procedure being performed Patient consent: the patient's understanding of the procedure matches consent given Procedure consent: procedure consent matches procedure scheduled Relevant documents: relevant documents present and verified Test results: test results available and properly labeled Site marked: the operative site was marked Imaging studies: imaging studies available Required items: required blood products, implants, devices, and special equipment available Patient identity confirmed: verbally with patient Time out: Immediately prior to procedure a "time out" was called to verify the correct patient, procedure, equipment, support staff and site/side marked as required. Local anesthesia used: no  Anesthesia: Local anesthesia used: no  Sedation: Patient sedated: yes Sedatives: etomidate and midazolam Analgesia: hydromorphone Vitals: Vital signs were monitored during sedation. Patient tolerance: Patient tolerated the procedure well with no immediate complications       Medications Ordered in ED Medications  fentaNYL (SUBLIMAZE) injection 50 mcg (not administered)  oxyCODONE-acetaminophen (PERCOCET/ROXICET) 5-325 MG per tablet 1 tablet (1 tablet Oral Given 12/31/15 1612)     Initial Impression / Assessment and Plan / ED Course  I have reviewed the triage vital  signs and the nursing notes.  Pertinent labs & imaging results that were available during my care of the patient were reviewed by me and considered in my medical decision making (see chart for details).  Clinical Course   70 year old female with no significant past medical history presents to the ED today to evaluate after a fall onto left knee. X-ray reveals a significantly  displaced distal femur fracture. Patient is neurovascularly intact. No other trauma or injury noted. Patient is not anticoagulated. 6:29 PM Spoke with Dr. Shon Baton with orthopedics who will consult pt in ED.   Dr. Shon Baton plans to admit patient to [facility]. Patient was consciously sedated for fracture reduction and placed in the immobilizer. We'll arrange for transfer to [facility].  Patient was discussed with and seen by Dr. Particia Nearing who agrees with the treatment plan.    Final Clinical Impressions(s) / ED Diagnoses   Final diagnoses:  Closed fracture of distal end of left femur, unspecified fracture morphology, initial encounter [facility])    New Prescriptions New Prescriptions   No medications on file     Dub Mikes, PA-C 12/31/15 2056    Jacalyn Lefevre, MD 12/31/15 2336    Dub Mikes, PA-C 02/11/16 1035    Jacalyn Lefevre, MD 02/11/16 1428

## 2015-12-31 NOTE — ED Triage Notes (Signed)
Pt tripped on 2 steps this am and injured L knee. L knee swollen. Pt has knee bent in wheelchair. Has been unable to bear weight on knee. Denies any other injury. Not on blood thinners.

## 2015-12-31 NOTE — H&P (Signed)
MILLER, Haywood PaoLISA NACZKI, MD Chief Complaint: S/p fall History: Heidi RossettiKarin Boyd is a 70 y.o. female with no significant pmhx who presents to the ED today to be evaluated after a mechanical fall. Pt states that she was walking down the stairs when her foot slipped on the 2nd to last stair and she fell forward landing on her right knee. She denies and head injury, LOC, other trauma. Pt has been unable to straighten left leg or ambulate since accident. Injury occurred approximately 6 hours ago. Pt does not take blood thinners.  History reviewed. No pertinent past medical history.  No Known Allergies  No current facility-administered medications on file prior to encounter.    No current outpatient prescriptions on file prior to encounter.    Physical Exam: Vitals:   12/31/15 1933 12/31/15 1933  BP: 144/80 144/80  Pulse: 96 98  Resp: 20 17  Temp: 98 F (36.7 C)    A+O x3 2+ DP/PT pulses Compartments soft/NT No SOB/CP   Lungs: CTA Abd soft/NT No pain/deformity of UE Left LE:  Knee flexed and swollen.  Tender to palpation.  NO laceration/abrasion EHL/TA/GA 5/5 Cap refill<2 sec  Image: Ct Knee Left Wo Contrast  Result Date: 12/31/2015 CLINICAL DATA:  Evaluate left knee fracture.  Initial encounter. EXAM: CT OF THE LEFT KNEE WITHOUT CONTRAST TECHNIQUE: Multidetector CT imaging of the left knee was performed according to the standard protocol. Multiplanar CT image reconstructions were also generated. COMPARISON:  Left knee radiographs performed earlier today at 3:11 p.m. FINDINGS: Bones/Joint/Cartilage There is a comminuted and displaced fracture of the distal femur, with a large posteriorly, proximally and laterally displaced lateral condylar fragment, and an additional mildly displaced fracture line extending across the medial femoral metadiaphysis. The larger medial condylar fragment demonstrates mild posterior displacement and angulation. No additional fractures are seen. There is diffuse  chondrocalcinosis at the joint space, and moderate hemarthrosis is noted, with blood tracking throughout Hoffa's fat pad. The cartilage is not well assessed on CT. Ligaments The posterior cruciate ligament appears intact. The anterior cruciate ligament is not well assessed on CT. The medial collateral ligament and lateral collateral ligament complex are difficult to fully assess. Muscles and Tendons The visualized musculature is grossly unremarkable. The quadriceps and patellar tendons remain intact. Soft tissues The vasculature is not well assessed. No additional soft tissue abnormalities are seen. IMPRESSION: 1. Comminuted and displaced fracture of the distal femur, with a large posteriorly, proximally and laterally displaced lateral condylar fragment, and additional mildly displaced fracture line extending across the medial femoral metadiaphysis. The larger medial condylar fragment demonstrates mild posterior displacement and angulation. 2. Moderate hemarthrosis, with blood tracking throughout Hoffa's fat pad. 3. Diffuse chondrocalcinosis at the joint space. Electronically Signed   By: Roanna RaiderJeffery  Chang M.D.   On: 12/31/2015 18:46   Dg Knee Complete 4 Views Left  Result Date: 12/31/2015 CLINICAL DATA:  Pt c/o severe knee pain today w/ obv deformity s/p slipped on bottom two steps at her son's house today, landing on knee. EXAM: LEFT KNEE - COMPLETE 4+ VIEW COMPARISON:  None. FINDINGS: Significantly displaced fracture of the distal left femur, with avulsion of a large portion of the lateral femoral condyle, with probable additional impaction at the fracture site. Suspect additional minimally displaced fracture within the medial femoral condyle. No obvious fracture seen within the proximal tibia or fibula. IMPRESSION: 1. Significantly displaced fracture of the distal left femur, with avulsion and rotation of a large portion of the lateral femoral condyle, with probable  additional impaction at the fracture site.  2. Suspect additional minimally displaced fracture within the medial femoral condyle. Electronically Signed   By: Bary Richard M.D.   On: 12/31/2015 15:37    A/P: Patient s/p fall with closed comminuted distal left femur fracture.  Patient otherwise healthy. Underwent sedation for gentle closed reduction and application of knee immobilizer in the ER Clinical exam stable - no evidence of compartment syndrome, or NV injury Plan on transfer to [facility] for definitive fracture management Monday. Spoke with Dr Linna Caprice - he will take over care and preform ORIF.

## 2016-01-01 ENCOUNTER — Inpatient Hospital Stay (HOSPITAL_COMMUNITY): Payer: Medicare Other

## 2016-01-01 LAB — CBC
HCT: 38.5 % (ref 36.0–46.0)
Hemoglobin: 13 g/dL (ref 12.0–15.0)
MCH: 33.9 pg (ref 26.0–34.0)
MCHC: 33.8 g/dL (ref 30.0–36.0)
MCV: 100.3 fL — AB (ref 78.0–100.0)
PLATELETS: 239 10*3/uL (ref 150–400)
RBC: 3.84 MIL/uL — ABNORMAL LOW (ref 3.87–5.11)
RDW: 12.9 % (ref 11.5–15.5)
WBC: 11 10*3/uL — ABNORMAL HIGH (ref 4.0–10.5)

## 2016-01-01 LAB — BASIC METABOLIC PANEL
Anion gap: 12 (ref 5–15)
BUN: 9 mg/dL (ref 6–20)
CALCIUM: 8.7 mg/dL — AB (ref 8.9–10.3)
CO2: 20 mmol/L — ABNORMAL LOW (ref 22–32)
CREATININE: 0.6 mg/dL (ref 0.44–1.00)
Chloride: 106 mmol/L (ref 101–111)
GFR calc Af Amer: >60 mL/min (ref >60)
GLUCOSE: 103 mg/dL — AB (ref 65–99)
Potassium: 3.7 mmol/L (ref 3.5–5.1)
Sodium: 138 mmol/L (ref 135–145)

## 2016-01-01 LAB — SURGICAL PCR SCREEN
MRSA, PCR: NEGATIVE
Staphylococcus aureus: NEGATIVE

## 2016-01-01 LAB — TYPE AND SCREEN
ABO/RH(D): O POS
Antibody Screen: NEGATIVE

## 2016-01-01 LAB — ABO/RH: ABO/RH(D): O POS

## 2016-01-01 MED ORDER — KETOROLAC TROMETHAMINE 30 MG/ML IJ SOLN
INTRAMUSCULAR | Status: AC
  Filled 2016-01-01: qty 1

## 2016-01-01 MED ORDER — POTASSIUM CHLORIDE CRYS ER 20 MEQ PO TBCR
40.0000 meq | EXTENDED_RELEASE_TABLET | ORAL | Status: AC
  Administered 2016-01-01 (×2): 40 meq via ORAL
  Filled 2016-01-01 (×2): qty 2

## 2016-01-01 MED ORDER — POLYETHYLENE GLYCOL 3350 17 G PO PACK
17.0000 g | PACK | Freq: Every day | ORAL | Status: DC
  Administered 2016-01-01 – 2016-01-05 (×3): 17 g via ORAL
  Filled 2016-01-01 (×4): qty 1

## 2016-01-01 MED ORDER — SENNOSIDES-DOCUSATE SODIUM 8.6-50 MG PO TABS
1.0000 | ORAL_TABLET | Freq: Every day | ORAL | Status: DC
  Administered 2016-01-02 – 2016-01-04 (×3): 1 via ORAL
  Filled 2016-01-01 (×3): qty 1

## 2016-01-01 MED ORDER — KETOROLAC TROMETHAMINE 30 MG/ML IJ SOLN
30.0000 mg | Freq: Once | INTRAMUSCULAR | Status: AC
  Administered 2016-01-01: 30 mg via INTRAVENOUS

## 2016-01-01 MED ORDER — HYDROMORPHONE HCL 1 MG/ML IJ SOLN
1.0000 mg | Freq: Once | INTRAMUSCULAR | Status: AC
  Administered 2016-01-01: 1 mg via INTRAVENOUS
  Filled 2016-01-01: qty 1

## 2016-01-01 NOTE — Progress Notes (Signed)
Subjective:    Patient reports pain as moderate to left leg.  Reports pain following a fall. Tolerating current care. Denies SOB, CP, or calf pain.  Objective: Vital signs in last 24 hours: Temp:  [97.6 F (36.4 C)-98 F (36.7 C)] 97.6 F (36.4 C) (10/15 0617) Pulse Rate:  [79-105] 88 (10/15 0617) Resp:  [12-22] 16 (10/15 0617) BP: (105-148)/(64-87) 125/64 (10/15 0617) SpO2:  [94 %-100 %] 100 % (10/15 0617) Weight:  [47.9 kg (105 lb 9.6 oz)-54.4 kg (120 lb)] 47.9 kg (105 lb 9.6 oz) (10/15 0617)  Intake/Output from previous day: No intake/output data recorded. Intake/Output this shift: No intake/output data recorded.   Recent Labs  12/31/15 1625 01/01/16 0736  HGB 13.6 13.0    Recent Labs  12/31/15 1625 01/01/16 0736  WBC 8.8 11.0*  RBC 3.92 3.84*  HCT 39.2 38.5  PLT 290 239    Recent Labs  12/31/15 1625 01/01/16 0736  NA 136 138  K 2.9* 3.7  CL 106 106  CO2 20* 20*  BUN 12 9  CREATININE 0.62 0.60  GLUCOSE 99 103*  CALCIUM 9.6 8.7*    Recent Labs  12/31/15 1625  INR 0.90   Alert and oriented x3. RRR, Lungs clear, BS x4. Left Calf soft and non tender. Immobilizer inplace. No DVT signs. No signs of infection or compartment syndrome. LLE grossly neurovascularly intact.    Assessment/Plan:   Left femur fracture: NPO MN Bedrest  Plan to OR tomorrow by Dr.Siwnteck Continue current care  Braeden Dolinski L 01/01/2016, 8:56 AM

## 2016-01-01 NOTE — Progress Notes (Signed)
PROGRESS NOTE    Carine Nordgren  ZOX:[id number] DOB: [birth year] DOA: 12/31/2015 PCP: Carson Myrtle, MD    Brief Narrative: Mohogany Toppins is a 70 y.o. female with medical history significant of osteoporosis comes in after slipping on her stairs walking down and hurt her left leg.  Pt is overall very healthy.  Sees her PCP regularly twice a year.  There was no LOC during fall no other injury.  Found to have a distal left femur fracture.  Dr Shon Baton was called with ortho team who has just reduced the fracture and called for medical consult for preop evaluation, he plans to take to OR Monday.    Assessment & Plan:   Principal Problem:   Hypokalemia Active Problems:   Femur fracture, right (HCC)   Osteoporosis  1-Hypokalemia; replete with 40 meq times 2.  K increase this am.   2-Abnormal EKG; repeat EKG after correction of hypokalemia.  FU ECHO.  Patient denies chest pain or dyspnea.   3-Left  femur fracture;  Plan for sx tomorrow.  FU ECHO/  Suspect low risk for intermediate procedure. Patient walks every other day for Half hour, she does not expirience chest pain or dyspnea.  -will add Bowel regimen.    DVT prophylaxis: SCD per ortho  Code Status: Full code.  Family Communication: care discussed with patient  Disposition Plan: awaiting surgical procedure.       Procedures: none   Antimicrobials:  none   Subjective: She denies dyspnea or chest pain.   Objective: Vitals:   01/01/16 0100 01/01/16 0130 01/01/16 0233 01/01/16 0617  BP: 143/80 134/73 (!) 148/69 125/64  Pulse: 105 98 79 88  Resp: 22 14 16 16   Temp:   97.8 F (36.6 C) 97.6 F (36.4 C)  TempSrc:   Oral Oral  SpO2: 98% 94% 100% 100%  Weight:    47.9 kg (105 lb 9.6 oz)  Height:    5\' 3"  (1.6 m)   No intake or output data in the 24 hours ending 01/01/16 1110 Filed Weights   12/31/15 2358 01/01/16 0617  Weight: 54.4 kg (120 lb) 47.9 kg (105 lb 9.6 oz)    Examination:  General exam:  Appears calm and comfortable  Respiratory system: Clear to auscultation. Respiratory effort normal. Cardiovascular system: S1 & S2 heard, RRR. No JVD, murmurs, rubs, gallops or clicks. No pedal edema. Gastrointestinal system: Abdomen is nondistended, soft and nontender. No organomegaly or masses felt. Normal bowel sounds heard. Central nervous system: Alert and oriented. No focal neurological deficits. Extremities: Left LE on Immobilizer.  Skin: No rashes, lesions or ulcers Psychiatry: Judgement and insight appear normal. Mood & affect appropriate.     Data Reviewed: I have personally reviewed following labs and imaging studies  CBC:  Recent Labs Lab 12/31/15 1625 01/01/16 0736  WBC 8.8 11.0*  NEUTROABS 7.1  --   HGB 13.6 13.0  HCT 39.2 38.5  MCV 100.0 100.3*  PLT 290 239   Basic Metabolic Panel:  Recent Labs Lab 12/31/15 1625 01/01/16 0736  NA 136 138  K 2.9* 3.7  CL 106 106  CO2 20* 20*  GLUCOSE 99 103*  BUN 12 9  CREATININE 0.62 0.60  CALCIUM 9.6 8.7*   GFR: Estimated Creatinine Clearance: 48.1 mL/min (by C-G formula based on SCr of 0.6 mg/dL). Liver Function Tests: No results for input(s): AST, ALT, ALKPHOS, BILITOT, PROT, ALBUMIN in the last 168 hours. No results for input(s): LIPASE, AMYLASE in the last 168 hours. No  results for input(s): AMMONIA in the last 168 hours. Coagulation Profile:  Recent Labs Lab 12/31/15 1625  INR 0.90   Cardiac Enzymes: No results for input(s): CKTOTAL, CKMB, CKMBINDEX, TROPONINI in the last 168 hours. BNP (last 3 results) No results for input(s): PROBNP in the last 8760 hours. HbA1C: No results for input(s): HGBA1C in the last 72 hours. CBG: No results for input(s): GLUCAP in the last 168 hours. Lipid Profile: No results for input(s): CHOL, HDL, LDLCALC, TRIG, CHOLHDL, LDLDIRECT in the last 72 hours. Thyroid Function Tests: No results for input(s): TSH, T4TOTAL, FREET4, T3FREE, THYROIDAB in the last 72 hours. Anemia  Panel: No results for input(s): VITAMINB12, FOLATE, FERRITIN, TIBC, IRON, RETICCTPCT in the last 72 hours. Sepsis Labs: No results for input(s): PROCALCITON, LATICACIDVEN in the last 168 hours.  No results found for this or any previous visit (from the past 240 hour(s)).       Radiology Studies: Ct Knee Left Wo Contrast  Result Date: 12/31/2015 CLINICAL DATA:  Evaluate left knee fracture.  Initial encounter. EXAM: CT OF THE LEFT KNEE WITHOUT CONTRAST TECHNIQUE: Multidetector CT imaging of the left knee was performed according to the standard protocol. Multiplanar CT image reconstructions were also generated. COMPARISON:  Left knee radiographs performed earlier today at 3:11 p.m. FINDINGS: Bones/Joint/Cartilage There is a comminuted and displaced fracture of the distal femur, with a large posteriorly, proximally and laterally displaced lateral condylar fragment, and an additional mildly displaced fracture line extending across the medial femoral metadiaphysis. The larger medial condylar fragment demonstrates mild posterior displacement and angulation. No additional fractures are seen. There is diffuse chondrocalcinosis at the joint space, and moderate hemarthrosis is noted, with blood tracking throughout Hoffa's fat pad. The cartilage is not well assessed on CT. Ligaments The posterior cruciate ligament appears intact. The anterior cruciate ligament is not well assessed on CT. The medial collateral ligament and lateral collateral ligament complex are difficult to fully assess. Muscles and Tendons The visualized musculature is grossly unremarkable. The quadriceps and patellar tendons remain intact. Soft tissues The vasculature is not well assessed. No additional soft tissue abnormalities are seen. IMPRESSION: 1. Comminuted and displaced fracture of the distal femur, with a large posteriorly, proximally and laterally displaced lateral condylar fragment, and additional mildly displaced fracture line  extending across the medial femoral metadiaphysis. The larger medial condylar fragment demonstrates mild posterior displacement and angulation. 2. Moderate hemarthrosis, with blood tracking throughout Hoffa's fat pad. 3. Diffuse chondrocalcinosis at the joint space. Electronically Signed   By: Roanna Raider M.D.   On: 12/31/2015 18:46   Chest Portable 1 View  Result Date: 01/01/2016 CLINICAL DATA:  70 y/o  F; left distal femur fracture. EXAM: PORTABLE CHEST 1 VIEW COMPARISON:  09/04/2011 chest radiograph FINDINGS: Stable cardiac silhouette given differences in technique. Aortic atherosclerosis with arch calcification. Minor biapical pleural parenchymal scarring. No consolidation. No pneumothorax or effusion. Degenerative changes of the thoracic spine and mild dextro curvature upper thoracic spine. IMPRESSION: No active disease. Electronically Signed   By: Mitzi Hansen M.D.   On: 01/01/2016 00:51   Dg Knee Complete 4 Views Left  Result Date: 12/31/2015 CLINICAL DATA:  Pt c/o severe knee pain today w/ obv deformity s/p slipped on bottom two steps at her son's house today, landing on knee. EXAM: LEFT KNEE - COMPLETE 4+ VIEW COMPARISON:  None. FINDINGS: Significantly displaced fracture of the distal left femur, with avulsion of a large portion of the lateral femoral condyle, with probable additional impaction at the  fracture site. Suspect additional minimally displaced fracture within the medial femoral condyle. No obvious fracture seen within the proximal tibia or fibula. IMPRESSION: 1. Significantly displaced fracture of the distal left femur, with avulsion and rotation of a large portion of the lateral femoral condyle, with probable additional impaction at the fracture site. 2. Suspect additional minimally displaced fracture within the medial femoral condyle. Electronically Signed   By: Bary RichardStan  Maynard M.D.   On: 12/31/2015 15:37        Scheduled Meds: . enoxaparin (LOVENOX) injection  40  mg Subcutaneous Q24H  . etomidate  10 mg Intravenous Once  . midazolam  0.5 mg Intravenous Once  . potassium chloride  40 mEq Oral Q4H   Continuous Infusions: . lactated ringers 85 mL/hr at 01/01/16 0457     LOS: 1 day    Time spent: 35 minutes     Alba Coryegalado, Georg Ang A, MD [facility] Pager [phone]  If 7PM-7AM, please contact night-coverage [email or web address] Password [email or web address] 01/01/2016, 11:10 AM

## 2016-01-02 ENCOUNTER — Inpatient Hospital Stay (HOSPITAL_COMMUNITY): Payer: Medicare Other

## 2016-01-02 DIAGNOSIS — R9431 Abnormal electrocardiogram [ECG] [EKG]: Secondary | ICD-10-CM

## 2016-01-02 LAB — BASIC METABOLIC PANEL
ANION GAP: 6 (ref 5–15)
BUN: 5 mg/dL — ABNORMAL LOW (ref 6–20)
CALCIUM: 8.5 mg/dL — AB (ref 8.9–10.3)
CHLORIDE: 104 mmol/L (ref 101–111)
CO2: 24 mmol/L (ref 22–32)
Creatinine, Ser: 0.61 mg/dL (ref 0.44–1.00)
GFR calc non Af Amer: >60 mL/min (ref >60)
GLUCOSE: 111 mg/dL — AB (ref 65–99)
POTASSIUM: 5 mmol/L (ref 3.5–5.1)
Sodium: 134 mmol/L — ABNORMAL LOW (ref 135–145)

## 2016-01-02 LAB — ECHOCARDIOGRAM COMPLETE
CHL CUP DOP CALC LVOT VTI: 11.7 cm
CHL CUP MV DEC (S): 114
CHL CUP RV SYS PRESS: 31 mmHg
CHL CUP TV REG PEAK VELOCITY: 263 cm/s
E/e' ratio: 5.41
EWDT: 114 ms
FS: 29 % (ref 28–44)
HEIGHTINCHES: 63 in
IVS/LV PW RATIO, ED: 1.66
LA ID, A-P, ES: 23 mm
LA diam end sys: 23 mm
LA diam index: 1.58 cm/m2
LA vol A4C: 16.1 ml
LA vol index: 15.1 mL/m2
LA vol: 21.9 mL
LV E/e'average: 5.41
LV PW d: 7.93 mm — AB (ref 0.6–1.1)
LV TDI E'LATERAL: 11.3
LVEEMED: 5.41
LVELAT: 11.3 cm/s
LVOT area: 3.46 cm2
LVOT diameter: 21 mm
LVOTPV: 74.6 cm/s
LVOTSV: 40 mL
MV pk A vel: 106 m/s
MVPKEVEL: 61.1 m/s
RV LATERAL S' VELOCITY: 14 cm/s
TDI e' medial: 6.42
TR max vel: 263 cm/s
WEIGHTICAEL: 1689.61 [oz_av]

## 2016-01-02 LAB — MAGNESIUM: Magnesium: 1.4 mg/dL — ABNORMAL LOW (ref 1.7–2.4)

## 2016-01-02 MED ORDER — CEFAZOLIN SODIUM-DEXTROSE 2-4 GM/100ML-% IV SOLN
2.0000 g | INTRAVENOUS | Status: AC
Start: 2016-01-02 — End: 2016-01-03

## 2016-01-02 MED ORDER — POVIDONE-IODINE 10 % EX SWAB: 2.0000 "application " | Freq: Once | CUTANEOUS | Status: DC

## 2016-01-02 MED ORDER — MAGNESIUM SULFATE 2 GM/50ML IV SOLN
2.0000 g | Freq: Once | INTRAVENOUS | Status: AC
  Administered 2016-01-02: 2 g via INTRAVENOUS
  Filled 2016-01-02: qty 50

## 2016-01-02 MED ORDER — CHLORHEXIDINE GLUCONATE 4 % EX LIQD: 60.0000 mL | Freq: Once | CUTANEOUS | Status: DC

## 2016-01-02 MED ORDER — ENOXAPARIN SODIUM 40 MG/0.4ML ~~LOC~~ SOLN
40.0000 mg | Freq: Once | SUBCUTANEOUS | Status: AC
  Administered 2016-01-02: 40 mg via SUBCUTANEOUS
  Filled 2016-01-02: qty 0.4

## 2016-01-02 MED FILL — Diazepam Inj 5 MG/ML: INTRAMUSCULAR | Qty: 2 | Status: AC

## 2016-01-02 NOTE — Progress Notes (Signed)
Asked to assume care by Dr. Shon BatonBrooks.  AF VSS L knee: skin intact. KI in place. (+) TA/GS/EHL. SILT. 2+ DP.  A/P Intraarticular distal left femur fx. Osteoporosis. -NWB LLE -continue knee immobilizer for now -needs ORIF L distal femur. I discussed the R/B/A with patient and her son -due to lack of OR availability, will plan for surgery tomorrow evening -NPO after MN -give one dose lovenox now  The risks, benefits, and alternatives were discussed with the patient. There are risks associated with the surgery including, but not limited to, problems with anesthesia (death), infection, differences in leg length/angulation/rotation, fracture of bones, loosening or failure of implants, malunion, nonunion, hematoma (blood accumulation) which may require surgical drainage, blood clots, pulmonary embolism, nerve injury (foot drop), and blood vessel injury. The patient understands these risks and elects to proceed.

## 2016-01-02 NOTE — Progress Notes (Addendum)
PROGRESS NOTE    [name]  ZOX:[id number] DOB: 09/12/1945 DOA: 12/31/2015 PCP: Carson Myrtle, MD    Brief Narrative: [name] is a 70 y.o. female with medical history significant of osteoporosis comes in after slipping on her stairs walking down and hurt her left leg.  Pt is overall very healthy.  Sees her PCP regularly twice a year.  There was no LOC during fall no other injury.  Found to have a distal left femur fracture.  Dr Shon Baton was called with ortho team who has just reduced the fracture and called for medical consult for preop evaluation, he plans to take to OR Monday.    Assessment & Plan:   Principal Problem:   Hypokalemia Active Problems:   Femur fracture, right (HCC)   Osteoporosis  1-Hypokalemia; replete with 40 meq times 2.  K increase this am.   2-Abnormal EKG; Q wave V 1, V 2.  I have made ECHO stat. ECHO with normal EF, ok to proceed with sx.  Patient denies chest pain or dyspnea. She is able to walk every other day for half hour.   3-Left  femur fracture;  Plan for sx tomorrow.  ECHO with normal Ef Suspect low risk for intermediate procedure. Patient walks every other day for Half hour, she does not expirience chest pain or dyspnea.  -Bowel regimen.    DVT prophylaxis: SCD per ortho  Code Status: Full code.  Family Communication: care discussed with patient and son at bedside.  Disposition Plan: awaiting surgical procedure.       Procedures: none   Antimicrobials:  none   Subjective: She denies dyspnea or chest pain.   Objective: Vitals:   01/01/16 0617 01/01/16 1500 01/01/16 2007 01/02/16 0521  BP: 125/64 102/72 (!) 132/55 104/72  Pulse: 88 96 98 95  Resp: 16  16 16   Temp: 97.6 F (36.4 C) 98.7 F (37.1 C) 99.1 F (37.3 C) 98.4 F (36.9 C)  TempSrc: Oral Oral Oral Oral  SpO2: 100% 100% 96% 97%  Weight: 47.9 kg (105 lb 9.6 oz)     Height: 5\' 3"  (1.6 m)       Intake/Output Summary (Last 24 hours) at 01/02/16  1144 Last data filed at 01/02/16 0520  Gross per 24 hour  Intake          2701.58 ml  Output              600 ml  Net          2101.58 ml   Filed Weights   12/31/15 2358 01/01/16 0617  Weight: 54.4 kg (120 lb) 47.9 kg (105 lb 9.6 oz)    Examination:  General exam: Appears calm and comfortable  Respiratory system: Clear to auscultation. Respiratory effort normal. Cardiovascular system: S1 & S2 heard, RRR. No JVD, murmurs, rubs, gallops or clicks. No pedal edema. Gastrointestinal system: Abdomen is nondistended, soft and nontender. No organomegaly or masses felt. Normal bowel sounds heard. Central nervous system: Alert and oriented. No focal neurological deficits. Extremities: Left LE on Immobilizer.  Skin: No rashes, lesions or ulcers Psychiatry: Judgement and insight appear normal. Mood & affect appropriate.     Data Reviewed: I have personally reviewed following labs and imaging studies  CBC:  Recent Labs Lab 12/31/15 1625 01/01/16 0736  WBC 8.8 11.0*  NEUTROABS 7.1  --   HGB 13.6 13.0  HCT 39.2 38.5  MCV 100.0 100.3*  PLT 290 239   Basic Metabolic Panel:  Recent  Labs Lab 12/31/15 1625 01/01/16 0736 01/02/16 0349 01/02/16 0737  NA 136 138 134*  --   K 2.9* 3.7 5.0  --   CL 106 106 104  --   CO2 20* 20* 24  --   GLUCOSE 99 103* 111*  --   BUN 12 9 5*  --   CREATININE 0.62 0.60 0.61  --   CALCIUM 9.6 8.7* 8.5*  --   MG  --   --   --  1.4*   GFR: Estimated Creatinine Clearance: 48.1 mL/min (by C-G formula based on SCr of 0.61 mg/dL). Liver Function Tests: No results for input(s): AST, ALT, ALKPHOS, BILITOT, PROT, ALBUMIN in the last 168 hours. No results for input(s): LIPASE, AMYLASE in the last 168 hours. No results for input(s): AMMONIA in the last 168 hours. Coagulation Profile:  Recent Labs Lab 12/31/15 1625  INR 0.90   Cardiac Enzymes: No results for input(s): CKTOTAL, CKMB, CKMBINDEX, TROPONINI in the last 168 hours. BNP (last 3  results) No results for input(s): PROBNP in the last 8760 hours. HbA1C: No results for input(s): HGBA1C in the last 72 hours. CBG: No results for input(s): GLUCAP in the last 168 hours. Lipid Profile: No results for input(s): CHOL, HDL, LDLCALC, TRIG, CHOLHDL, LDLDIRECT in the last 72 hours. Thyroid Function Tests: No results for input(s): TSH, T4TOTAL, FREET4, T3FREE, THYROIDAB in the last 72 hours. Anemia Panel: No results for input(s): VITAMINB12, FOLATE, FERRITIN, TIBC, IRON, RETICCTPCT in the last 72 hours. Sepsis Labs: No results for input(s): PROCALCITON, LATICACIDVEN in the last 168 hours.  Recent Results (from the past 240 hour(s))  Surgical PCR screen     Status: None   Collection Time: 01/01/16 12:48 PM  Result Value Ref Range Status   MRSA, PCR NEGATIVE NEGATIVE Final   Staphylococcus aureus NEGATIVE NEGATIVE Final    Comment:        The Xpert SA Assay (FDA approved for NASAL specimens in patients over 72 years of age), is one component of a comprehensive surveillance program.  Test performance has been validated by [facility] for patients greater than or equal to 72 year old. It is not intended to diagnose infection nor to guide or monitor treatment.          Radiology Studies: Ct Knee Left Wo Contrast  Result Date: 12/31/2015 CLINICAL DATA:  Evaluate left knee fracture.  Initial encounter. EXAM: CT OF THE LEFT KNEE WITHOUT CONTRAST TECHNIQUE: Multidetector CT imaging of the left knee was performed according to the standard protocol. Multiplanar CT image reconstructions were also generated. COMPARISON:  Left knee radiographs performed earlier today at 3:11 p.m. FINDINGS: Bones/Joint/Cartilage There is a comminuted and displaced fracture of the distal femur, with a large posteriorly, proximally and laterally displaced lateral condylar fragment, and an additional mildly displaced fracture line extending across the medial femoral metadiaphysis. The larger medial  condylar fragment demonstrates mild posterior displacement and angulation. No additional fractures are seen. There is diffuse chondrocalcinosis at the joint space, and moderate hemarthrosis is noted, with blood tracking throughout Hoffa's fat pad. The cartilage is not well assessed on CT. Ligaments The posterior cruciate ligament appears intact. The anterior cruciate ligament is not well assessed on CT. The medial collateral ligament and lateral collateral ligament complex are difficult to fully assess. Muscles and Tendons The visualized musculature is grossly unremarkable. The quadriceps and patellar tendons remain intact. Soft tissues The vasculature is not well assessed. No additional soft tissue abnormalities are seen. IMPRESSION: 1.  Comminuted and displaced fracture of the distal femur, with a large posteriorly, proximally and laterally displaced lateral condylar fragment, and additional mildly displaced fracture line extending across the medial femoral metadiaphysis. The larger medial condylar fragment demonstrates mild posterior displacement and angulation. 2. Moderate hemarthrosis, with blood tracking throughout Hoffa's fat pad. 3. Diffuse chondrocalcinosis at the joint space. Electronically Signed   By: Roanna Raider M.D.   On: 12/31/2015 18:46   Dg Pelvis Portable  Result Date: 01/01/2016 CLINICAL DATA:  Closed displaced supracondylar fracture of the left femur. EXAM: PORTABLE PELVIS 1-2 VIEWS COMPARISON:  06/29/2005 acute abdominal series FINDINGS: No evidence of fracture or diastasis. Both hips are located and appear intact. Osteopenia and atherosclerosis. L4-5 asymmetric right disc narrowing. IMPRESSION: No acute finding. Electronically Signed   By: Marnee Spring M.D.   On: 01/01/2016 14:54   Chest Portable 1 View  Result Date: 01/01/2016 CLINICAL DATA:  70 y/o  F; left distal femur fracture. EXAM: PORTABLE CHEST 1 VIEW COMPARISON:  09/04/2011 chest radiograph FINDINGS: Stable cardiac  silhouette given differences in technique. Aortic atherosclerosis with arch calcification. Minor biapical pleural parenchymal scarring. No consolidation. No pneumothorax or effusion. Degenerative changes of the thoracic spine and mild dextro curvature upper thoracic spine. IMPRESSION: No active disease. Electronically Signed   By: Mitzi Hansen M.D.   On: 01/01/2016 00:51   Dg Knee Complete 4 Views Left  Result Date: 12/31/2015 CLINICAL DATA:  Pt c/o severe knee pain today w/ obv deformity s/p slipped on bottom two steps at her son's house today, landing on knee. EXAM: LEFT KNEE - COMPLETE 4+ VIEW COMPARISON:  None. FINDINGS: Significantly displaced fracture of the distal left femur, with avulsion of a large portion of the lateral femoral condyle, with probable additional impaction at the fracture site. Suspect additional minimally displaced fracture within the medial femoral condyle. No obvious fracture seen within the proximal tibia or fibula. IMPRESSION: 1. Significantly displaced fracture of the distal left femur, with avulsion and rotation of a large portion of the lateral femoral condyle, with probable additional impaction at the fracture site. 2. Suspect additional minimally displaced fracture within the medial femoral condyle. Electronically Signed   By: Bary Richard M.D.   On: 12/31/2015 15:37        Scheduled Meds: .  ceFAZolin (ANCEF) IV  2 g Intravenous To SSTC  . chlorhexidine  60 mL Topical Once  . etomidate  10 mg Intravenous Once  . magnesium sulfate 1 - 4 g bolus IVPB  2 g Intravenous Once  . midazolam  0.5 mg Intravenous Once  . polyethylene glycol  17 g Oral Daily  . povidone-iodine  2 application Topical Once  . senna-docusate  1 tablet Oral QHS   Continuous Infusions: . lactated ringers 85 mL/hr at 01/01/16 1606     LOS: 2 days    Time spent: 35 minutes     Alba Cory, MD [facility] Pager [phone]  If 7PM-7AM, please contact  night-coverage [email or web address] Password [email or web address] 01/02/2016, 11:44 AM

## 2016-01-02 NOTE — Progress Notes (Signed)
  Echocardiogram 2D Echocardiogram has been performed. [name], [name] 01/02/2016, 12:26 PM

## 2016-01-03 ENCOUNTER — Inpatient Hospital Stay (HOSPITAL_COMMUNITY): Payer: Medicare Other | Admitting: Anesthesiology

## 2016-01-03 ENCOUNTER — Encounter (HOSPITAL_COMMUNITY): Payer: Self-pay | Admitting: Certified Registered Nurse Anesthetist

## 2016-01-03 ENCOUNTER — Encounter (HOSPITAL_COMMUNITY)
Admission: EM | Disposition: A | Discharge status: Skilled Nursing Facility | Payer: Self-pay | Source: Home / Self Care | Attending: Orthopedic Surgery

## 2016-01-03 ENCOUNTER — Inpatient Hospital Stay (HOSPITAL_COMMUNITY): Payer: Medicare Other

## 2016-01-03 DIAGNOSIS — S72462A Displaced supracondylar fracture with intracondylar extension of lower end of left femur, initial encounter for closed fracture: Secondary | ICD-10-CM | POA: Diagnosis present

## 2016-01-03 HISTORY — PX: ORIF FEMUR FRACTURE: SHX2119

## 2016-01-03 LAB — BASIC METABOLIC PANEL
ANION GAP: 13 (ref 5–15)
BUN: 7 mg/dL (ref 6–20)
CHLORIDE: 103 mmol/L (ref 101–111)
CO2: 20 mmol/L — AB (ref 22–32)
CREATININE: 0.64 mg/dL (ref 0.44–1.00)
Calcium: 8.2 mg/dL — ABNORMAL LOW (ref 8.9–10.3)
GFR calc non Af Amer: >60 mL/min (ref >60)
GLUCOSE: 79 mg/dL (ref 65–99)
Potassium: 3.5 mmol/L (ref 3.5–5.1)
Sodium: 136 mmol/L (ref 135–145)

## 2016-01-03 LAB — CBC
HEMATOCRIT: 34.7 % — AB (ref 36.0–46.0)
HEMOGLOBIN: 12.1 g/dL (ref 12.0–15.0)
MCH: 34.7 pg — ABNORMAL HIGH (ref 26.0–34.0)
MCHC: 34.9 g/dL (ref 30.0–36.0)
MCV: 99.4 fL (ref 78.0–100.0)
Platelets: 229 10*3/uL (ref 150–400)
RBC: 3.49 MIL/uL — ABNORMAL LOW (ref 3.87–5.11)
RDW: 12.7 % (ref 11.5–15.5)
WBC: 10.1 10*3/uL (ref 4.0–10.5)

## 2016-01-03 SURGERY — OPEN REDUCTION INTERNAL FIXATION (ORIF) DISTAL FEMUR FRACTURE
Anesthesia: General | Site: Leg Upper | Laterality: Left

## 2016-01-03 MED ORDER — DEXAMETHASONE SODIUM PHOSPHATE 10 MG/ML IJ SOLN
INTRAMUSCULAR | Status: AC
  Filled 2016-01-03: qty 1

## 2016-01-03 MED ORDER — SUGAMMADEX SODIUM 200 MG/2ML IV SOLN
INTRAVENOUS | Status: DC | PRN
  Administered 2016-01-03: 200 mg via INTRAVENOUS

## 2016-01-03 MED ORDER — PHENYLEPHRINE 40 MCG/ML (10ML) SYRINGE FOR IV PUSH (FOR BLOOD PRESSURE SUPPORT)
PREFILLED_SYRINGE | INTRAVENOUS | Status: AC
  Filled 2016-01-03: qty 20

## 2016-01-03 MED ORDER — SUFENTANIL CITRATE 50 MCG/ML IV SOLN
INTRAVENOUS | Status: DC | PRN
  Administered 2016-01-03: 10 ug via INTRAVENOUS

## 2016-01-03 MED ORDER — SODIUM CHLORIDE 0.9 % IJ SOLN
INTRAMUSCULAR | Status: AC
  Filled 2016-01-03: qty 10

## 2016-01-03 MED ORDER — ROCURONIUM BROMIDE 10 MG/ML (PF) SYRINGE
PREFILLED_SYRINGE | INTRAVENOUS | Status: AC
  Filled 2016-01-03: qty 10

## 2016-01-03 MED ORDER — ONDANSETRON HCL 4 MG/2ML IJ SOLN: 4.0000 mg | Freq: Once | INTRAMUSCULAR | Status: DC | PRN

## 2016-01-03 MED ORDER — ONDANSETRON HCL 4 MG/2ML IJ SOLN
INTRAMUSCULAR | Status: AC
  Filled 2016-01-03: qty 2

## 2016-01-03 MED ORDER — EPHEDRINE 5 MG/ML INJ
INTRAVENOUS | Status: AC
  Filled 2016-01-03: qty 20

## 2016-01-03 MED ORDER — LACTATED RINGERS IV SOLN
INTRAVENOUS | Status: DC
Start: 2016-01-03 — End: 2016-01-06
  Administered 2016-01-03 (×3): via INTRAVENOUS

## 2016-01-03 MED ORDER — PROPOFOL 10 MG/ML IV BOLUS
INTRAVENOUS | Status: AC
  Filled 2016-01-03: qty 20

## 2016-01-03 MED ORDER — SUFENTANIL CITRATE 50 MCG/ML IV SOLN
INTRAVENOUS | Status: AC
  Filled 2016-01-03: qty 1

## 2016-01-03 MED ORDER — ESMOLOL HCL 100 MG/10ML IV SOLN
INTRAVENOUS | Status: AC
  Filled 2016-01-03: qty 10

## 2016-01-03 MED ORDER — FENTANYL CITRATE (PF) 100 MCG/2ML IJ SOLN
INTRAMUSCULAR | Status: AC
  Filled 2016-01-03: qty 2

## 2016-01-03 MED ORDER — FENTANYL CITRATE (PF) 100 MCG/2ML IJ SOLN
INTRAMUSCULAR | Status: AC
  Filled 2016-01-03: qty 4

## 2016-01-03 MED ORDER — DEXAMETHASONE SODIUM PHOSPHATE 4 MG/ML IJ SOLN
INTRAMUSCULAR | Status: DC | PRN
  Administered 2016-01-03: 4 mg via INTRAVENOUS

## 2016-01-03 MED ORDER — PHENYLEPHRINE HCL 10 MG/ML IJ SOLN
INTRAMUSCULAR | Status: DC | PRN
  Administered 2016-01-03: 80 ug via INTRAVENOUS

## 2016-01-03 MED ORDER — ONDANSETRON HCL 4 MG/2ML IJ SOLN
INTRAMUSCULAR | Status: DC | PRN
  Administered 2016-01-03: 4 mg via INTRAVENOUS

## 2016-01-03 MED ORDER — HYDROMORPHONE HCL 2 MG/ML IJ SOLN: 0.5000 mg | INTRAMUSCULAR | Status: DC | PRN

## 2016-01-03 MED ORDER — FENTANYL CITRATE (PF) 100 MCG/2ML IJ SOLN
INTRAMUSCULAR | Status: DC | PRN
  Administered 2016-01-03 (×7): 50 ug via INTRAVENOUS
  Administered 2016-01-03 (×3): 100 ug via INTRAVENOUS
  Administered 2016-01-03: 50 ug via INTRAVENOUS

## 2016-01-03 MED ORDER — METOPROLOL TARTRATE 5 MG/5ML IV SOLN
INTRAVENOUS | Status: DC | PRN
  Administered 2016-01-03: 2 mg via INTRAVENOUS

## 2016-01-03 MED ORDER — CEFAZOLIN SODIUM-DEXTROSE 2-3 GM-% IV SOLR
INTRAVENOUS | Status: DC | PRN
  Administered 2016-01-03: 2 g via INTRAVENOUS

## 2016-01-03 MED ORDER — HYDROMORPHONE HCL 2 MG/ML IJ SOLN: 0.2500 mg | INTRAMUSCULAR | Status: DC | PRN

## 2016-01-03 MED ORDER — HYDROCODONE-ACETAMINOPHEN 7.5-325 MG PO TABS
1.0000 | ORAL_TABLET | Freq: Once | ORAL | Status: DC | PRN
Start: 2016-01-03 — End: 2016-01-03

## 2016-01-03 MED ORDER — ESMOLOL HCL 100 MG/10ML IV SOLN
INTRAVENOUS | Status: DC | PRN
  Administered 2016-01-03: 30 mg via INTRAVENOUS

## 2016-01-03 MED ORDER — LIDOCAINE 2% (20 MG/ML) 5 ML SYRINGE
INTRAMUSCULAR | Status: AC
  Filled 2016-01-03: qty 5

## 2016-01-03 MED ORDER — LIDOCAINE HCL (CARDIAC) 20 MG/ML IV SOLN
INTRAVENOUS | Status: DC | PRN
  Administered 2016-01-03: 80 mg via INTRAVENOUS

## 2016-01-03 MED ORDER — SUCCINYLCHOLINE CHLORIDE 200 MG/10ML IV SOSY
PREFILLED_SYRINGE | INTRAVENOUS | Status: AC
  Filled 2016-01-03: qty 10

## 2016-01-03 MED ORDER — 0.9 % SODIUM CHLORIDE (POUR BTL) OPTIME
TOPICAL | Status: DC | PRN
  Administered 2016-01-03: 1000 mL

## 2016-01-03 MED ORDER — ROCURONIUM BROMIDE 100 MG/10ML IV SOLN
INTRAVENOUS | Status: DC | PRN
  Administered 2016-01-03: 50 mg via INTRAVENOUS

## 2016-01-03 MED ORDER — PROPOFOL 10 MG/ML IV BOLUS
INTRAVENOUS | Status: DC | PRN
  Administered 2016-01-03: 100 mg via INTRAVENOUS

## 2016-01-03 SURGICAL SUPPLY — 77 items
ADH SKN CLS LQ APL DERMABOND (GAUZE/BANDAGES/DRESSINGS) ×2
BAG SPEC THK2 15X12 ZIP CLS (MISCELLANEOUS) ×1
BAG ZIPLOCK 12X15 (MISCELLANEOUS) ×2 IMPLANT
BANDAGE ACE 4X5 VEL STRL LF (GAUZE/BANDAGES/DRESSINGS) ×2 IMPLANT
BANDAGE ACE 6X5 VEL STRL LF (GAUZE/BANDAGES/DRESSINGS) ×2 IMPLANT
BANDAGE ELASTIC 6 VELCRO ST LF (GAUZE/BANDAGES/DRESSINGS) ×1 IMPLANT
BIT DRILL 4.3 (BIT) ×2 IMPLANT
BIT DRILL 4.3X300MM (BIT) IMPLANT
BNDG ADH 5X4 AIR PERM ELC (GAUZE/BANDAGES/DRESSINGS) ×1
BNDG COHESIVE 4X5 TAN STRL (GAUZE/BANDAGES/DRESSINGS) ×2 IMPLANT
BNDG COHESIVE 4X5 WHT NS (GAUZE/BANDAGES/DRESSINGS) ×1 IMPLANT
BNDG GAUZE ELAST 4 BULKY (GAUZE/BANDAGES/DRESSINGS) ×1 IMPLANT
CAP LOCK NCB (Cap) ×5 IMPLANT
CHLORAPREP W/TINT 26ML (MISCELLANEOUS) ×2 IMPLANT
CUFF TOURN SGL QUICK 34 (TOURNIQUET CUFF) ×2
CUFF TRNQT CYL 34X4X40X1 (TOURNIQUET CUFF) ×1 IMPLANT
DERMABOND ADHESIVE PROPEN (GAUZE/BANDAGES/DRESSINGS) ×2
DERMABOND ADVANCED .7 DNX6 (GAUZE/BANDAGES/DRESSINGS) IMPLANT
DRAPE C-ARM 42X120 X-RAY (DRAPES) ×2 IMPLANT
DRAPE C-ARMOR (DRAPES) ×2 IMPLANT
DRAPE LG THREE QUARTER DISP (DRAPES) ×2 IMPLANT
DRAPE U-SHAPE 47X51 STRL (DRAPES) ×2 IMPLANT
DRESSING ADAPTIC 1/2  N-ADH (PACKING) ×1 IMPLANT
DRILL BIT 4.3 (BIT) ×2
DRILL TWIST CANN AO 2.8X120 (DRILL) ×1 IMPLANT
DRSG ADAPTIC 3X8 NADH LF (GAUZE/BANDAGES/DRESSINGS) ×2 IMPLANT
DRSG AQUACEL AG ADV 3.5X14 (GAUZE/BANDAGES/DRESSINGS) ×1 IMPLANT
DRSG PAD ABDOMINAL 8X10 ST (GAUZE/BANDAGES/DRESSINGS) ×2 IMPLANT
ELECT REM PT RETURN 9FT ADLT (ELECTROSURGICAL) ×2
ELECTRODE REM PT RTRN 9FT ADLT (ELECTROSURGICAL) ×1 IMPLANT
GAUZE SPONGE 4X4 12PLY STRL (GAUZE/BANDAGES/DRESSINGS) ×4 IMPLANT
GLOVE BIOGEL PI IND STRL 7.5 (GLOVE) ×1 IMPLANT
GLOVE BIOGEL PI IND STRL 8.5 (GLOVE) ×1 IMPLANT
GLOVE BIOGEL PI INDICATOR 7.5 (GLOVE) ×2
GLOVE BIOGEL PI INDICATOR 8.5 (GLOVE) ×1
GLOVE ECLIPSE 8.0 STRL XLNG CF (GLOVE) IMPLANT
GLOVE ORTHO TXT STRL SZ7.5 (GLOVE) ×4 IMPLANT
GLOVE SURG ORTHO 8.0 STRL STRW (GLOVE) ×2 IMPLANT
GLOVE SURG SS PI 7.5 STRL IVOR (GLOVE) ×1 IMPLANT
GOWN SPEC L3 XXLG W/TWL (GOWN DISPOSABLE) ×4 IMPLANT
GOWN STRL REUS W/TWL LRG LVL3 (GOWN DISPOSABLE) ×2 IMPLANT
K-WIRE 2.0 (WIRE) ×2
K-WIRE FXSTD 280X2XNS SS (WIRE) ×1
K-WIRE TROCAR 1.3X150 RND (WIRE) ×2 IMPLANT
KWIRE FXSTD 280X2XNS SS (WIRE) IMPLANT
MANIFOLD NEPTUNE II (INSTRUMENTS) ×2 IMPLANT
NS IRRIG 1000ML POUR BTL (IV SOLUTION) ×2 IMPLANT
PACK TOTAL KNEE CUSTOM (KITS) ×2 IMPLANT
PAD ABD 8X10 STRL (GAUZE/BANDAGES/DRESSINGS) ×1 IMPLANT
PAD CAST 4YDX4 CTTN HI CHSV (CAST SUPPLIES) ×2 IMPLANT
PADDING CAST ABS 6INX4YD NS (CAST SUPPLIES) ×2
PADDING CAST ABS COTTON 6X4 NS (CAST SUPPLIES) ×2 IMPLANT
PADDING CAST COTTON 4X4 STRL (CAST SUPPLIES) ×4
PADDING CAST COTTON 6X4 STRL (CAST SUPPLIES) ×1 IMPLANT
PLATE DIST FEM 12H (Plate) ×1 IMPLANT
POSITIONER SURGICAL ARM (MISCELLANEOUS) ×2 IMPLANT
SCREW 5.0 32MM (Screw) ×2 IMPLANT
SCREW 5.0 60MM (Screw) ×1 IMPLANT
SCREW CANN COMP CBS TI 7.5X45 (Screw) ×1 IMPLANT
SCREW CANN COMP CBS TI 7.5X50 (Screw) ×2 IMPLANT
SCREW CORTICAL NCB 5.0X65 (Screw) ×4 IMPLANT
SCREW NCB 5.0X30MM (Screw) ×1 IMPLANT
SCREW NCB 5.0X34MM (Screw) ×1 IMPLANT
SOL PREP POV-IOD 4OZ 10% (MISCELLANEOUS) ×2 IMPLANT
SPONGE GAUZE 4X4 12PLY STER LF (GAUZE/BANDAGES/DRESSINGS) ×1 IMPLANT
STRIP CLOSURE SKIN 1/2X4 (GAUZE/BANDAGES/DRESSINGS) ×2 IMPLANT
SUT ETHILON 3 0 PS 1 (SUTURE) ×2 IMPLANT
SUT MNCRL AB 4-0 PS2 18 (SUTURE) ×3 IMPLANT
SUT MON AB 2-0 CT1 36 (SUTURE) ×2 IMPLANT
SUT VIC AB 1 CT1 27 (SUTURE) ×8
SUT VIC AB 1 CT1 27XBRD ANBCTR (SUTURE) IMPLANT
SUT VIC AB 1 CT1 27XBRD ANTBC (SUTURE) ×2 IMPLANT
SUT VIC AB 2-0 CT1 27 (SUTURE) ×2
SUT VIC AB 2-0 CT1 TAPERPNT 27 (SUTURE) ×1 IMPLANT
SUT VIC AB 3-0 PS2 18 (SUTURE) ×2
SUT VIC AB 3-0 PS2 18XBRD (SUTURE) ×1 IMPLANT
TOWEL OR 17X26 10 PK STRL BLUE (TOWEL DISPOSABLE) ×4 IMPLANT

## 2016-01-03 NOTE — Interval H&P Note (Signed)
History and Physical Interval Note:  01/03/2016 3:15 PM  Feliciana RossettiKarin Dall  has presented today for surgery, with the diagnosis of Intra-articular fracute left distal femur  The various methods of treatment have been discussed with the patient and family. After consideration of risks, benefits and other options for treatment, the patient has consented to  Procedure(s): OPEN REDUCTION INTERNAL FIXATION (ORIF) DISTAL FEMUR FRACTURE (Left) as a surgical intervention .  The patient's history has been reviewed, patient examined, no change in status, stable for surgery.  I have reviewed the patient's chart and labs.  Questions were answered to the patient's satisfaction.    The risks, benefits, and alternatives were discussed with the patient. There are risks associated with the surgery including, but not limited to, problems with anesthesia (death), infection, differences in leg length/angulation/rotation, fracture of bones, loosening or failure of implants, malunion, nonunion, hematoma (blood accumulation) which may require surgical drainage, blood clots, pulmonary embolism, nerve injury (foot drop), and blood vessel injury. The patient understands these risks and elects to proceed.   Zaccai Chavarin, Cloyde ReamsBrian James

## 2016-01-03 NOTE — Transfer of Care (Signed)
Immediate Anesthesia Transfer of Care Note  Patient: [name]  Procedure(s) Performed: Procedure(s): OPEN REDUCTION INTERNAL FIXATION (ORIF) DISTAL FEMUR FRACTURE (Left)  Patient Location: PACU  Anesthesia Type:General  Level of Consciousness: awake  Airway & Oxygen Therapy: Patient Spontanous Breathing and Patient connected to face mask oxygen  Post-op Assessment: Report given to RN and Post -op Vital signs reviewed and stable  Post vital signs: Reviewed and stable  Last Vitals:  Vitals:   01/03/16 1414 01/03/16 1955  BP: 105/62 (!) (P) 108/96  Pulse: 86 (!) (P) 108  Resp: 17   Temp: 37.2 C     Last Pain:  Vitals:   01/03/16 1414  TempSrc: Oral  PainSc:       Patients Stated Pain Goal: 3 (01/03/16 0103)  Complications: No apparent anesthesia complications

## 2016-01-03 NOTE — Anesthesia Procedure Notes (Signed)
Procedure Name: Intubation Date/Time: 01/03/2016 4:32 PM Performed by: Roney MansSMITH, Cameren Earnest P Pre-anesthesia Checklist: Patient identified, Emergency Drugs available, Suction available, Patient being monitored and Timeout performed Patient Re-evaluated:Patient Re-evaluated prior to inductionOxygen Delivery Method: Circle system utilized Preoxygenation: Pre-oxygenation with 100% oxygen Intubation Type: IV induction Ventilation: Mask ventilation without difficulty Laryngoscope Size: Mac Grade View: Grade I Tube type: Oral Tube size: 7.0 mm Number of attempts: 1 Airway Equipment and Method: Stylet Placement Confirmation: ETT inserted through vocal cords under direct vision,  positive ETCO2 and breath sounds checked- equal and bilateral Secured at: 22 cm Tube secured with: Tape Dental Injury: Teeth and Oropharynx as per pre-operative assessment

## 2016-01-03 NOTE — Discharge Instructions (Signed)
Dr. Samson Frederic Adult Hip & Knee Specialist [facility] [address], [address] [place], [place] [address] [phone]   POSTOPERATIVE DIRECTIONS    Rehabilitation, Guidelines Following Surgery   WEIGHT BEARING Other:  Non weight bearing left leg   HOME CARE INSTRUCTIONS  Remove items at home which could result in a fall. This includes throw rugs or furniture in walking pathways.  Continue medications as instructed at time of discharge.  You may have some home medications which will be placed on hold until you complete the course of blood thinner medication.  4 days after discharge, you may start showering. No tub baths or soaking your incisions. Do not put on socks or shoes without following the instructions of your caregivers.   Sit on chairs with arms. Use the chair arms to help push yourself up when arising.  Arrange for the use of a toilet seat elevator so you are not sitting low.   Walk with walker as instructed.  You may resume a sexual relationship in one month or when given the OK by your caregiver.  Use walker as long as suggested by your caregivers.  Avoid periods of inactivity such as sitting longer than an hour when not asleep. This helps prevent blood clots.  You may return to work once you are cleared by Designer, industrial/product.  Do not drive a car for 6 weeks or until released by your surgeon.  Do not drive while taking narcotics.  Wear elastic stockings for two weeks following surgery during the day but you may remove then at night.  Make sure you keep all of your appointments after your operation with all of your doctors and caregivers. You should call the office at the above phone number and make an appointment for approximately two weeks after the date of your surgery. Please pick up a stool softener and laxative for home use as long as you are requiring pain medications.  ICE to the affected hip every three hours for 30 minutes at a time and then  as needed for pain and swelling. Continue to use ice on the hip for pain and swelling from surgery. You may notice swelling that will progress down to the foot and ankle.  This is normal after surgery.  Elevate the leg when you are not up walking on it.   It is important for you to complete the blood thinner medication as prescribed by your doctor.  Continue to use the breathing machine which will help keep your temperature down.  It is common for your temperature to cycle up and down following surgery, especially at night when you are not up moving around and exerting yourself.  The breathing machine keeps your lungs expanded and your temperature down.  RANGE OF MOTION AND STRENGTHENING EXERCISES  These exercises are designed to help you keep full movement of your hip joint. Follow your caregiver's or physical therapist's instructions. Perform all exercises about fifteen times, three times per day or as directed. Exercise both hips, even if you have had only one joint replacement. These exercises can be done on a training (exercise) mat, on the floor, on a table or on a bed. Use whatever works the best and is most comfortable for you. Use music or television while you are exercising so that the exercises are a pleasant break in your day. This will make your life better with the exercises acting as a break in routine you can look forward to.  Lying on your  back, slowly slide your foot toward your buttocks, raising your knee up off the floor. Then slowly slide your foot back down until your leg is straight again.  Lying on your back spread your legs as far apart as you can without causing discomfort.  Lying on your side, raise your upper leg and foot straight up from the floor as far as is comfortable. Slowly lower the leg and repeat.  Lying on your back, tighten up the muscle in the front of your thigh (quadriceps muscles). You can do this by keeping your leg straight and trying to raise your heel off the  floor. This helps strengthen the largest muscle supporting your knee.  Lying on your back, tighten up the muscles of your buttocks both with the legs straight and with the knee bent at a comfortable angle while keeping your heel on the floor.   SKILLED REHAB INSTRUCTIONS: If the patient is transferred to a skilled rehab facility following release from the hospital, a list of the current medications will be sent to the facility for the patient to continue.  When discharged from the skilled rehab facility, please have the facility set up the patient's Home Health Physical Therapy prior to being released. Also, the skilled facility will be responsible for providing the patient with their medications at time of release from the facility to include their pain medication and their blood thinner medication. If the patient is still at the rehab facility at time of the two week follow up appointment, the skilled rehab facility will also need to assist the patient in arranging follow up appointment in our office and any transportation needs.  MAKE SURE YOU:  Understand these instructions.  Will watch your condition.  Will get help right away if you are not doing well or get worse.  Pick up stool softner and laxative for home use following surgery while on pain medications. Daily dry dressing changes as needed. Continue to use ice for pain and swelling after surgery. Do not use any lotions or creams on the incision until instructed by your surgeon. Wear Knee brace at all times.

## 2016-01-03 NOTE — Anesthesia Preprocedure Evaluation (Signed)
Anesthesia Evaluation  Patient identified by MRN, date of birth, ID band Patient awake    Reviewed: Allergy & Precautions, H&P , NPO status , Patient's Chart, lab work & pertinent test results  History of Anesthesia Complications Negative for: history of anesthetic complications  Airway Mallampati: II  TM Distance: >3 FB Neck ROM: full    Dental no notable dental hx.    Pulmonary Current Smoker,    Pulmonary exam normal breath sounds clear to auscultation       Cardiovascular negative cardio ROS Normal cardiovascular exam Rhythm:regular Rate:Normal     Neuro/Psych negative neurological ROS     GI/Hepatic negative GI ROS, Neg liver ROS,   Endo/Other  negative endocrine ROS  Renal/GU negative Renal ROS     Musculoskeletal   Abdominal   Peds  Hematology negative hematology ROS (+)   Anesthesia Other Findings   Reproductive/Obstetrics negative OB ROS                             Anesthesia Physical Anesthesia Plan  ASA: II  Anesthesia Plan: General   Post-op Pain Management:    Induction: Intravenous  Airway Management Planned: Oral ETT  Additional Equipment:   Intra-op Plan:   Post-operative Plan: Extubation in OR  Informed Consent: I have reviewed the patients History and Physical, chart, labs and discussed the procedure including the risks, benefits and alternatives for the proposed anesthesia with the patient or authorized representative who has indicated his/her understanding and acceptance.   Dental Advisory Given  Plan Discussed with: Anesthesiologist, CRNA and Surgeon  Anesthesia Plan Comments:         Anesthesia Quick Evaluation

## 2016-01-03 NOTE — Brief Op Note (Signed)
12/31/2015 - 01/03/2016  7:19 PM  PATIENT:  [name]  70 y.o. female  PRE-OPERATIVE DIAGNOSIS:  Intra-articular fracute left distal femur  POST-OPERATIVE DIAGNOSIS:  Intra-articular fracute left distal femur  PROCEDURE:  Procedure(s): OPEN REDUCTION INTERNAL FIXATION (ORIF) DISTAL FEMUR FRACTURE (Left)  SURGEON:  Surgeon(s) and Role:    * Samson FredericBrian Kynley Metzger, MD - Primary  PHYSICIAN ASSISTANT: none  ASSISTANTS: carla bethune, rnfa   ANESTHESIA:   general  EBL:  Total I/O In: 1000 [I.V.:1000] Out: 45 [Urine:45]  BLOOD ADMINISTERED:none  DRAINS: none   LOCAL MEDICATIONS USED:  NONE  SPECIMEN:  No Specimen  DISPOSITION OF SPECIMEN:  N/A  COUNTS:  YES  TOURNIQUET:  * No tourniquets in log *  DICTATION: .Other Dictation: Dictation Number not provided by telephone system  PLAN OF CARE: Admit to inpatient   PATIENT DISPOSITION:  PACU - hemodynamically stable.   Delay start of Pharmacological VTE agent (>24hrs) due to surgical blood loss or risk of bleeding: no

## 2016-01-03 NOTE — Progress Notes (Signed)
PROGRESS NOTE    [name]  ZOX:[id number] DOB: 1945-08-09 DOA: 12/31/2015 PCP: Carson Myrtle, MD    Brief Narrative: [name] is a 70 y.o. female with medical history significant of osteoporosis comes in after slipping on her stairs walking down and hurt her left leg.  Pt is overall very healthy.  Sees her PCP regularly twice a year.  There was no LOC during fall no other injury.  Found to have a distal left femur fracture.  Dr Shon Baton was called with ortho team who has just reduced the fracture and called for medical consult for preop evaluation, he plans to take to OR Monday.    Assessment & Plan:   Principal Problem:   Hypokalemia Active Problems:   Femur fracture, right (HCC)   Osteoporosis  1-Hypokalemia; resolved.   2-Abnormal EKG; Q wave V 1, V 2.  I have made ECHO stat. ECHO with normal EF, ok to proceed with sx.  Patient denies chest pain or dyspnea. She is able to walk every other day for half hour.   3-Left  femur fracture;  Plan for sx tomorrow.  ECHO with normal Ef low risk for intermediate procedure. Patient walks every other day for Half hour, she does not expirience chest pain or dyspnea.  -Bowel regimen.   4-hypomagnesemia; repleted IV.  Repeat labs in am.    DVT prophylaxis: SCD per ortho  Code Status: Full code.  Family Communication: care discussed with patient  Disposition Plan: awaiting surgical procedure.       Procedures: none   Antimicrobials:  none   Subjective: She denies dyspnea or chest pain.  Pain is well controlled.   Objective: Vitals:   01/02/16 0521 01/02/16 1500 01/02/16 2100 01/03/16 0425  BP: 104/72 (!) 147/63 96/72 101/75  Pulse: 95 (!) 101 99 100  Resp: 16 16 16 16   Temp: 98.4 F (36.9 C) 98.5 F (36.9 C) 99.1 F (37.3 C) 98.8 F (37.1 C)  TempSrc: Oral  Oral Oral  SpO2: 97% 100% 98% 99%  Weight:      Height:        Intake/Output Summary (Last 24 hours) at 01/03/16 1217 Last data filed at  01/03/16 0900  Gross per 24 hour  Intake                0 ml  Output              400 ml  Net             -400 ml   Filed Weights   12/31/15 2358 01/01/16 0617  Weight: 54.4 kg (120 lb) 47.9 kg (105 lb 9.6 oz)    Examination:  General exam: Appears calm and comfortable  Respiratory system: Clear to auscultation. Respiratory effort normal. Cardiovascular system: S1 & S2 heard, RRR. No JVD, murmurs, rubs, gallops or clicks. No pedal edema. Gastrointestinal system: Abdomen is nondistended, soft and nontender. No organomegaly or masses felt. Normal bowel sounds heard. Central nervous system: Alert and oriented. No focal neurological deficits. Extremities: Left LE on Immobilizer.  Skin: No rashes, lesions or ulcers Psychiatry: Judgement and insight appear normal. Mood & affect appropriate.     Data Reviewed: I have personally reviewed following labs and imaging studies  CBC:  Recent Labs Lab 12/31/15 1625 01/01/16 0736 01/03/16 0935  WBC 8.8 11.0* 10.1  NEUTROABS 7.1  --   --   HGB 13.6 13.0 12.1  HCT 39.2 38.5 34.7*  MCV 100.0 100.3* 99.4  PLT 290 239 229   Basic Metabolic Panel:  Recent Labs Lab 12/31/15 1625 01/01/16 0736 01/02/16 0349 01/02/16 0737 01/03/16 0935  NA 136 138 134*  --  136  K 2.9* 3.7 5.0  --  3.5  CL 106 106 104  --  103  CO2 20* 20* 24  --  20*  GLUCOSE 99 103* 111*  --  79  BUN 12 9 5*  --  7  CREATININE 0.62 0.60 0.61  --  0.64  CALCIUM 9.6 8.7* 8.5*  --  8.2*  MG  --   --   --  1.4*  --    GFR: Estimated Creatinine Clearance: 48.1 mL/min (by C-G formula based on SCr of 0.64 mg/dL). Liver Function Tests: No results for input(s): AST, ALT, ALKPHOS, BILITOT, PROT, ALBUMIN in the last 168 hours. No results for input(s): LIPASE, AMYLASE in the last 168 hours. No results for input(s): AMMONIA in the last 168 hours. Coagulation Profile:  Recent Labs Lab 12/31/15 1625  INR 0.90   Cardiac Enzymes: No results for input(s): CKTOTAL,  CKMB, CKMBINDEX, TROPONINI in the last 168 hours. BNP (last 3 results) No results for input(s): PROBNP in the last 8760 hours. HbA1C: No results for input(s): HGBA1C in the last 72 hours. CBG: No results for input(s): GLUCAP in the last 168 hours. Lipid Profile: No results for input(s): CHOL, HDL, LDLCALC, TRIG, CHOLHDL, LDLDIRECT in the last 72 hours. Thyroid Function Tests: No results for input(s): TSH, T4TOTAL, FREET4, T3FREE, THYROIDAB in the last 72 hours. Anemia Panel: No results for input(s): VITAMINB12, FOLATE, FERRITIN, TIBC, IRON, RETICCTPCT in the last 72 hours. Sepsis Labs: No results for input(s): PROCALCITON, LATICACIDVEN in the last 168 hours.  Recent Results (from the past 240 hour(s))  Surgical PCR screen     Status: None   Collection Time: 01/01/16 12:48 PM  Result Value Ref Range Status   MRSA, PCR NEGATIVE NEGATIVE Final   Staphylococcus aureus NEGATIVE NEGATIVE Final    Comment:        The Xpert SA Assay (FDA approved for NASAL specimens in patients over 72 years of age), is one component of a comprehensive surveillance program.  Test performance has been validated by [facility] for patients greater than or equal to 72 year old. It is not intended to diagnose infection nor to guide or monitor treatment.          Radiology Studies: Dg Pelvis Portable  Result Date: 01/01/2016 CLINICAL DATA:  Closed displaced supracondylar fracture of the left femur. EXAM: PORTABLE PELVIS 1-2 VIEWS COMPARISON:  06/29/2005 acute abdominal series FINDINGS: No evidence of fracture or diastasis. Both hips are located and appear intact. Osteopenia and atherosclerosis. L4-5 asymmetric right disc narrowing. IMPRESSION: No acute finding. Electronically Signed   By: Marnee SpringJonathon  Watts M.D.   On: 01/01/2016 14:54        Scheduled Meds: . chlorhexidine  60 mL Topical Once  . etomidate  10 mg Intravenous Once  . midazolam  0.5 mg Intravenous Once  . polyethylene glycol  17  g Oral Daily  . povidone-iodine  2 application Topical Once  . senna-docusate  1 tablet Oral QHS   Continuous Infusions: . lactated ringers 85 mL/hr at 01/03/16 0644     LOS: 3 days    Time spent: 35 minutes     Alba Coryegalado, Trayonna Bachmeier A, MD [facility] Pager [phone]  If 7PM-7AM, please contact night-coverage [email or web address] Password [email or web address] 01/03/2016, 12:17 PM

## 2016-01-04 ENCOUNTER — Encounter (HOSPITAL_COMMUNITY): Payer: Self-pay | Admitting: Orthopedic Surgery

## 2016-01-04 LAB — CBC
HCT: 29.1 % — ABNORMAL LOW (ref 36.0–46.0)
HCT: 28.7 % — ABNORMAL LOW (ref 36.0–46.0)
Hemoglobin: 9.9 g/dL — ABNORMAL LOW (ref 12.0–15.0)
Hemoglobin: 9.8 g/dL — ABNORMAL LOW (ref 12.0–15.0)
MCH: 34 pg (ref 26.0–34.0)
MCH: 34.3 pg — AB (ref 26.0–34.0)
MCHC: 34 g/dL (ref 30.0–36.0)
MCHC: 34.1 g/dL (ref 30.0–36.0)
MCV: 100 fL (ref 78.0–100.0)
MCV: 100.3 fL — ABNORMAL HIGH (ref 78.0–100.0)
PLATELETS: 250 10*3/uL (ref 150–400)
Platelets: 281 10*3/uL (ref 150–400)
RBC: 2.91 MIL/uL — ABNORMAL LOW (ref 3.87–5.11)
RBC: 2.86 MIL/uL — ABNORMAL LOW (ref 3.87–5.11)
RDW: 12.6 % (ref 11.5–15.5)
RDW: 12.7 % (ref 11.5–15.5)
WBC: 8.3 10*3/uL (ref 4.0–10.5)
WBC: 9.3 10*3/uL (ref 4.0–10.5)

## 2016-01-04 LAB — BASIC METABOLIC PANEL
ANION GAP: 12 (ref 5–15)
BUN: 11 mg/dL (ref 6–20)
CALCIUM: 7.8 mg/dL — AB (ref 8.9–10.3)
CO2: 20 mmol/L — ABNORMAL LOW (ref 22–32)
CREATININE: 0.69 mg/dL (ref 0.44–1.00)
Chloride: 104 mmol/L (ref 101–111)
Glucose, Bld: 110 mg/dL — ABNORMAL HIGH (ref 65–99)
Potassium: 4 mmol/L (ref 3.5–5.1)
Sodium: 136 mmol/L (ref 135–145)

## 2016-01-04 LAB — CREATININE, SERUM
CREATININE: 0.71 mg/dL (ref 0.44–1.00)
GFR calc Af Amer: >60 mL/min (ref >60)

## 2016-01-04 LAB — MAGNESIUM: Magnesium: 1.6 mg/dL — ABNORMAL LOW (ref 1.7–2.4)

## 2016-01-04 MED ORDER — PHENOL 1.4 % MT LIQD: 1.0000 | OROMUCOSAL | Status: DC | PRN

## 2016-01-04 MED ORDER — MENTHOL 3 MG MT LOZG: 1.0000 | LOZENGE | OROMUCOSAL | Status: DC | PRN

## 2016-01-04 MED ORDER — ENOXAPARIN SODIUM 40 MG/0.4ML ~~LOC~~ SOLN: 40.0000 mg | SUBCUTANEOUS | 0 refills | Status: DC

## 2016-01-04 MED ORDER — METOCLOPRAMIDE HCL 5 MG/ML IJ SOLN: 5.0000 mg | Freq: Three times a day (TID) | INTRAMUSCULAR | Status: DC | PRN

## 2016-01-04 MED ORDER — OXYCODONE HCL 5 MG PO TABS: 5.0000 mg | ORAL_TABLET | ORAL | 0 refills | Status: DC | PRN

## 2016-01-04 MED ORDER — ACETAMINOPHEN 650 MG RE SUPP: 650.0000 mg | Freq: Four times a day (QID) | RECTAL | Status: DC | PRN

## 2016-01-04 MED ORDER — ENOXAPARIN SODIUM 40 MG/0.4ML ~~LOC~~ SOLN
40.0000 mg | SUBCUTANEOUS | Status: DC
  Administered 2016-01-04 – 2016-01-06 (×3): 40 mg via SUBCUTANEOUS
  Filled 2016-01-04 (×3): qty 0.4

## 2016-01-04 MED ORDER — DIPHENHYDRAMINE HCL 25 MG PO CAPS: 25.0000 mg | ORAL_CAPSULE | Freq: Four times a day (QID) | ORAL | Status: DC | PRN

## 2016-01-04 MED ORDER — ONDANSETRON HCL 4 MG/2ML IJ SOLN: 4.0000 mg | Freq: Four times a day (QID) | INTRAMUSCULAR | Status: DC | PRN

## 2016-01-04 MED ORDER — METOCLOPRAMIDE HCL 5 MG PO TABS: 5.0000 mg | ORAL_TABLET | Freq: Three times a day (TID) | ORAL | Status: DC | PRN

## 2016-01-04 MED ORDER — CEFAZOLIN SODIUM-DEXTROSE 2-4 GM/100ML-% IV SOLN
2.0000 g | Freq: Four times a day (QID) | INTRAVENOUS | Status: AC
  Administered 2016-01-04 (×2): 2 g via INTRAVENOUS
  Filled 2016-01-04 (×2): qty 100

## 2016-01-04 MED ORDER — MAGNESIUM SULFATE 2 GM/50ML IV SOLN
2.0000 g | Freq: Once | INTRAVENOUS | Status: AC
  Administered 2016-01-04: 2 g via INTRAVENOUS
  Filled 2016-01-04: qty 50

## 2016-01-04 MED ORDER — ACETAMINOPHEN 325 MG PO TABS
650.0000 mg | ORAL_TABLET | Freq: Four times a day (QID) | ORAL | Status: DC | PRN
  Administered 2016-01-06: 650 mg via ORAL
  Filled 2016-01-04: qty 2

## 2016-01-04 MED ORDER — ENOXAPARIN SODIUM 40 MG/0.4ML ~~LOC~~ SOLN: 40.0000 mg | SUBCUTANEOUS | Status: DC

## 2016-01-04 MED ORDER — ONDANSETRON HCL 4 MG PO TABS: 4.0000 mg | ORAL_TABLET | Freq: Four times a day (QID) | ORAL | Status: DC | PRN

## 2016-01-04 NOTE — Progress Notes (Signed)
   Subjective:  Patient reports pain as mild to moderate.  Denies N/V/CP/SOB.  Objective:   VITALS:   Vitals:   01/03/16 2122 01/04/16 0034 01/04/16 0430 01/04/16 0500  BP: 110/89 123/66 (!) 110/52 (!) 100/49  Pulse: (!) 105 100 (!) 102 95  Resp: 16 16 16 16   Temp: 97.4 F (36.3 C) 97.9 F (36.6 C) 98.1 F (36.7 C) 98 F (36.7 C)  TempSrc: Oral Oral Oral Oral  SpO2: 100% 98% 95% 95%  Weight:      Height:        NAD Neurologically intact Sensation intact distally Intact pulses distally Dorsiflexion/Plantar flexion intact Incision: dressing C/D/I Compartment soft Knee immobilizer in place   Lab Results  Component Value Date   WBC 10.1 01/03/2016   HGB 12.1 01/03/2016   HCT 34.7 (L) 01/03/2016   MCV 99.4 01/03/2016   PLT 229 01/03/2016   BMET    Component Value Date/Time   NA 136 01/04/2016 0611   K 4.0 01/04/2016 0611   CL 104 01/04/2016 0611   CO2 20 (L) 01/04/2016 0611   GLUCOSE 110 (H) 01/04/2016 0611   BUN 11 01/04/2016 0611   CREATININE 0.69 01/04/2016 0611   CALCIUM 7.8 (L) 01/04/2016 0611   GFRNONAA >60 01/04/2016 0611   GFRAA >60 01/04/2016 16100611     Assessment/Plan: 1 Day Post-Op   Principal Problem:   Hypokalemia Active Problems:   Femur fracture, right (HCC)   Osteoporosis   Closed displaced supracondylar fracture of distal end of left femur with intracondylar extension (HCC)   NWB LLE Cont knee immobilizer until patient is fitted for Bledsoe brace DVT ppx: lovenox x30 days, SCDs, TEDs PO pain control PT/OT Appreciate hospitalist input Dispo: D/C planning, likely SNF placement    [name], Cloyde ReamsBrian James 01/04/2016, 7:44 AM   Samson FredericBrian Osborne Serio, MD Cell [phone]

## 2016-01-04 NOTE — Progress Notes (Signed)
PROGRESS NOTE    [name]  WUJ:[id number] DOB: [birth year] DOA: 12/31/2015 PCP: Carson Myrtle, MD    Brief Narrative: [name] is a 70 y.o. female with medical history significant of osteoporosis comes in after slipping on her stairs walking down and hurt her left leg.  Pt is overall very healthy.  Sees her PCP regularly twice a year.  There was no LOC during fall no other injury.  Found to have a distal left femur fracture.  Dr Shon Baton was called with ortho team who has just reduced the fracture and called for medical consult for preop evaluation, he plans to take to OR Monday.    Assessment & Plan:   Principal Problem:   Hypokalemia Active Problems:   Femur fracture, right (HCC)   Osteoporosis   Closed displaced supracondylar fracture of distal end of left femur with intracondylar extension (HCC)  1-Hypokalemia; resolved.   2-Abnormal EKG; Q wave V 1, V 2.  I have made ECHO stat. ECHO with normal EF, ok to proceed with sx.  Patient denies chest pain or dyspnea. She is able to walk every other day for half hour.  Tolerated procedure well.   3-Left  femur fracture;  S/P open reduction internal Fixation distal femur fracture.  ECHO with normal Ef low risk for intermediate procedure. Patient walks every other day for Half hour, she does not expirience chest pain or dyspnea.  -Bowel regimen.   4-hypomagnesemia; repleted IV.  Mg thia am at 1.6. Repeat labs in am.    DVT prophylaxis: SCD per ortho  Code Status: Full code.  Family Communication: care discussed with patient  Disposition Plan: awaiting surgical procedure.       Procedures: none   Antimicrobials:  none   Subjective: Feeling well, pain controlled.    Objective: Vitals:   01/03/16 2122 01/04/16 0034 01/04/16 0430 01/04/16 0500  BP: 110/89 123/66 (!) 110/52 (!) 100/49  Pulse: (!) 105 100 (!) 102 95  Resp: 16 16 16 16   Temp: 97.4 F (36.3 C) 97.9 F (36.6 C) 98.1 F (36.7 C) 98 F  (36.7 C)  TempSrc: Oral Oral Oral Oral  SpO2: 100% 98% 95% 95%  Weight:      Height:        Intake/Output Summary (Last 24 hours) at 01/04/16 1356 Last data filed at 01/04/16 0915  Gross per 24 hour  Intake             2240 ml  Output             1400 ml  Net              840 ml   Filed Weights   12/31/15 2358 01/01/16 0617  Weight: 54.4 kg (120 lb) 47.9 kg (105 lb 9.6 oz)    Examination:  General exam: Appears calm and comfortable  Respiratory system: Clear to auscultation. Respiratory effort normal. Cardiovascular system: S1 & S2 heard, RRR. No JVD, murmurs, rubs, gallops or clicks. No pedal edema. Gastrointestinal system: Abdomen is nondistended, soft and nontender. No organomegaly or masses felt. Normal bowel sounds heard. Central nervous system: Alert and oriented. No focal neurological deficits. Extremities: Left LE on Immobilizer. Clean dressing.  Skin: No rashes, lesions or ulcers Psychiatry: Judgement and insight appear normal. Mood & affect appropriate.     Data Reviewed: I have personally reviewed following labs and imaging studies  CBC:  Recent Labs Lab 12/31/15 1625 01/01/16 0736 01/03/16 0935 01/04/16 9562 01/04/16 1236  WBC 8.8 11.0* 10.1 8.3 9.3  NEUTROABS 7.1  --   --   --   --   HGB 13.6 13.0 12.1 9.9* 9.8*  HCT 39.2 38.5 34.7* 29.1* 28.7*  MCV 100.0 100.3* 99.4 100.0 100.3*  PLT 290 239 229 250 281   Basic Metabolic Panel:  Recent Labs Lab 12/31/15 1625 01/01/16 0736 01/02/16 0349 01/02/16 0737 01/03/16 0935 01/04/16 0611 01/04/16 1236  NA 136 138 134*  --  136 136  --   K 2.9* 3.7 5.0  --  3.5 4.0  --   CL 106 106 104  --  103 104  --   CO2 20* 20* 24  --  20* 20*  --   GLUCOSE 99 103* 111*  --  79 110*  --   BUN 12 9 5*  --  7 11  --   CREATININE 0.62 0.60 0.61  --  0.64 0.69 0.71  CALCIUM 9.6 8.7* 8.5*  --  8.2* 7.8*  --   MG  --   --   --  1.4*  --  1.6*  --    GFR: Estimated Creatinine Clearance: 48.1 mL/min (by C-G  formula based on SCr of 0.71 mg/dL). Liver Function Tests: No results for input(s): AST, ALT, ALKPHOS, BILITOT, PROT, ALBUMIN in the last 168 hours. No results for input(s): LIPASE, AMYLASE in the last 168 hours. No results for input(s): AMMONIA in the last 168 hours. Coagulation Profile:  Recent Labs Lab 12/31/15 1625  INR 0.90   Cardiac Enzymes: No results for input(s): CKTOTAL, CKMB, CKMBINDEX, TROPONINI in the last 168 hours. BNP (last 3 results) No results for input(s): PROBNP in the last 8760 hours. HbA1C: No results for input(s): HGBA1C in the last 72 hours. CBG: No results for input(s): GLUCAP in the last 168 hours. Lipid Profile: No results for input(s): CHOL, HDL, LDLCALC, TRIG, CHOLHDL, LDLDIRECT in the last 72 hours. Thyroid Function Tests: No results for input(s): TSH, T4TOTAL, FREET4, T3FREE, THYROIDAB in the last 72 hours. Anemia Panel: No results for input(s): VITAMINB12, FOLATE, FERRITIN, TIBC, IRON, RETICCTPCT in the last 72 hours. Sepsis Labs: No results for input(s): PROCALCITON, LATICACIDVEN in the last 168 hours.  Recent Results (from the past 240 hour(s))  Surgical PCR screen     Status: None   Collection Time: 01/01/16 12:48 PM  Result Value Ref Range Status   MRSA, PCR NEGATIVE NEGATIVE Final   Staphylococcus aureus NEGATIVE NEGATIVE Final    Comment:        The Xpert SA Assay (FDA approved for NASAL specimens in patients over 72 years of age), is one component of a comprehensive surveillance program.  Test performance has been validated by [facility] for patients greater than or equal to 72 year old. It is not intended to diagnose infection nor to guide or monitor treatment.          Radiology Studies: Dg C-arm 61-120 Min  Result Date: 01/03/2016 CLINICAL DATA:  Open reduction and internal fixation of left distal femur fracture. EXAM: LEFT FEMUR 2 VIEWS; DG C-ARM 61-120 MIN Fluoroscopy time:  2 minutes 25 seconds. COMPARISON:   Radiographs of December 31, 2015. FINDINGS: Four intraoperative fluoroscopic images retained at the distal left femur. These images demonstrate internal fixation of distal left femoral fracture. Good alignment of fracture components is noted. IMPRESSION: Status post surgical internal fixation of distal left femoral fracture. Electronically Signed   By: Lupita RaiderJames  Green Jr, M.D.   On: 01/03/2016 21:00  Dg Femur Min 2 Views Left  Result Date: 01/03/2016 CLINICAL DATA:  Open reduction and internal fixation of left distal femur fracture. EXAM: LEFT FEMUR 2 VIEWS; DG C-ARM 61-120 MIN Fluoroscopy time:  2 minutes 25 seconds. COMPARISON:  Radiographs of December 31, 2015. FINDINGS: Four intraoperative fluoroscopic images retained at the distal left femur. These images demonstrate internal fixation of distal left femoral fracture. Good alignment of fracture components is noted. IMPRESSION: Status post surgical internal fixation of distal left femoral fracture. Electronically Signed   By: Lupita Raider, M.D.   On: 01/03/2016 21:00   Dg Femur Port Min 2 Views Left  Result Date: 01/03/2016 CLINICAL DATA:  Postop ORIF. EXAM: LEFT FEMUR PORTABLE 2 VIEWS COMPARISON:  Intra operative images same day FINDINGS: There is plate and screw fixation of a comminuted distal femur fracture. Near anatomic restoration. Articular surfaces appear restored. IMPRESSION: Good appearance following ORIF. Electronically Signed   By: Paulina Fusi M.D.   On: 01/03/2016 21:09        Scheduled Meds: .  ceFAZolin (ANCEF) IV  2 g Intravenous Q6H  . enoxaparin (LOVENOX) injection  40 mg Subcutaneous Q24H  . polyethylene glycol  17 g Oral Daily  . senna-docusate  1 tablet Oral QHS   Continuous Infusions: . lactated ringers 50 mL/hr at 01/03/16 1437     LOS: 4 days    Time spent: 35 minutes     Alba Cory, MD [facility] Pager [phone]  If 7PM-7AM, please contact night-coverage [email or web address] Password  [email or web address] 01/04/2016, 1:56 PM

## 2016-01-04 NOTE — Anesthesia Postprocedure Evaluation (Signed)
Anesthesia Post Note  Patient: Feliciana RossettiKarin Mcglothlin  Procedure(s) Performed: Procedure(s) (LRB): OPEN REDUCTION INTERNAL FIXATION (ORIF) DISTAL FEMUR FRACTURE (Left)  Patient location during evaluation: PACU Anesthesia Type: General Level of consciousness: awake and alert and oriented Pain management: pain level controlled Vital Signs Assessment: post-procedure vital signs reviewed and stable Respiratory status: spontaneous breathing, nonlabored ventilation, respiratory function stable and patient connected to nasal cannula oxygen Cardiovascular status: blood pressure returned to baseline and stable Postop Assessment: no signs of nausea or vomiting Anesthetic complications: no     Last Vitals:  Vitals:   01/04/16 0500 01/04/16 1500  BP: (!) 100/49 (!) 94/52  Pulse: 95 (!) 111  Resp: 16 16  Temp: 36.7 C 36.8 C    Last Pain:  Vitals:   01/04/16 1500  TempSrc: Oral  PainSc:    Pain Goal: Patients Stated Pain Goal: 3 (01/04/16 0051)               Maraki Macquarrie A.

## 2016-01-04 NOTE — Op Note (Signed)
NAMRuthe Boyd:  Boyd, [name]                ACCOUNT NO.:  [id number]  MEDICAL RECORD NO.:  [id number]  LOCATION:  [id number]                        FACILITY:  [facility]  PHYSICIAN:  Samson FredericBrian Cloyd Ragas, MD     DATE OF BIRTH:  Dec 24, 1945  DATE OF PROCEDURE:  01/03/2016 DATE OF DISCHARGE:                              OPERATIVE REPORT   SURGEON:  Samson FredericBrian Adelena Desantiago, MD  ASSISTING:  Patrick Jupiterarla Bethune, RNFA.  PREOPERATIVE DIAGNOSIS:  Supracondylar left femur fracture with intraarticular extension.  POSTOPERATIVE DIAGNOSIS:  Supracondylar left femur fracture with intraarticular extension.  PROCEDURE PERFORMED:  Open reduction and internal fixation of left distal femur fracture.  IMPLANTS: 1. Zimmer NCB periprosthetic femur fracture plate 95-AOZH12-hole with 5.0 mm     screws. 2. Zimmer 7.5 mm headless compression screws x2.  ANTIBIOTICS:  2 g of Ancef.  ANESTHESIA:  General.  EBL:  150 mL.  COMPLICATIONS:  None.  TUBES AND DRAINS:  None.  DISPOSITION:  Stable to PACU.  INDICATIONS:  The patient is a 70 year old female with known osteoporosis who had a ground level fall at home.  She had left knee pain and inability to weight bear.  She was brought to the emergency department where x-ray and CT scan revealed a comminuted left supracondylar distal femur fracture with intraarticular extension. Risks, benefits, and alternatives to operative fixation were explained to the patient and her son, they elected to proceed.  The patient was seen by the Hospitalist Service for perioperative risk stratification, medical optimization.  DESCRIPTION OF PROCEDURE IN DETAIL:  I identified the patient in the holding area using 2 identifiers.  The surgical site was marked by myself.  She was taken to the operating room, placed supine on the operating room table.  General anesthesia was induced.  The left lower extremity was prepped and draped in normal sterile surgical fashion. Time-out was called verifying side and site of  surgery.  I marked a swashbuckler incision.  I incised the IT band.  The vastus was reflected anteriorly.  I made an arthrotomy taking care to preserve the lateral meniscus.  The patella was subluxated medially.  I identified the fracture.  She had a comminuted supracondylar femur fracture.  The lateral femoral condyle was displaced and rotated.  I cleaned the fracture site with rongeur and curette.  I irrigated the fracture hematoma.  I reduced the lateral femoral condyle to the medial femoral condyle.  This was held with a clamp.  I then placed guide pins anterior to Blumensaat's line and then placed two 7.5 mm headless compression screws.  I then reduced the articular block to the shaft of the femur. She had severe anterior comminution just proximal to the trochlear groove.  I was able to achieve an anatomic articular reduction.  I selected a 12-hole plate.  I slid the plate into place submuscularly, I pinned the plate distally, proximally I secured the plate with a drill bit.  I then placed a total of 4 screws in the distal segment.  I placed a total of 4 bicortical screws in the shaft of the femur.  I applied locking caps to the distal screws.  I obtained final AP and lateral fluoroscopy views.  The reduction was near anatomic.  Hardware positioning appropriate.  I copiously irrigated the wound with saline. Please note that the bone was very osteoporotic.  The wound was closed in layers with #1 Vicryl and 0 V-Loc for the IT band, 2-0 Monocryl for the deep dermal layer, 3-0 running Monocryl subcuticular stitch.  Glue was applied.  Once the glue was fully hardened, Aquacel dressing, followed by bulky dressing and an Ace wrap, knee immobilizer was placed. The patient was then extubated, taken to PACU in stable condition. Sponge, needle, and instrument counts were correct at the end of the case x2.  There were no known complications.  Postoperatively, we will order a Bledsoe brace.  We  will discontinue the knee immobilizer once the hinge brace arrives.  We will set the brace from 0 to 90 degrees.  She will be non-weight bearing.  We will place her on Lovenox for DVT prophylaxis.  She will receive physical therapy. I will see her in the office 2 weeks after discharge.          ______________________________ Samson Frederic, MD     BS/MEDQ  D:  01/03/2016  T:  01/04/2016  Job:  [id number]

## 2016-01-04 NOTE — Progress Notes (Signed)
Orthopedic Tech Progress Note Patient Details:  Heidi RossettiKarin Boyd 10/12/1945 [id number]  Patient ID: Heidi RossettiKarin Boyd, female   DOB: 12/27/1945, 70 y.o.   MRN: [id number]   Nikki DomCrawford, [name] 01/04/2016, 10:06 AM Called in bio-tech brace order; spoke with Judeth CornfieldStephanie

## 2016-01-04 NOTE — Progress Notes (Signed)
Ortho contacted regarding Bledsoe brace; they are coming this am.  MD, would like left leg NWB and brace 0-90.

## 2016-01-04 NOTE — Progress Notes (Signed)
Office called to report pt itching under ace wrap and to request medication for itching.

## 2016-01-04 NOTE — Care Management Important Message (Signed)
Important Message  Patient Details  Name: Heidi RossettiKarin Boyd MRN: [id number] Date of Birth: 08/10/1945   Medicare Important Message Given:  Yes    Jony Ladnier 01/04/2016, 10:06 AM

## 2016-01-05 DIAGNOSIS — E876 Hypokalemia: Secondary | ICD-10-CM

## 2016-01-05 DIAGNOSIS — S72402A Unspecified fracture of lower end of left femur, initial encounter for closed fracture: Secondary | ICD-10-CM

## 2016-01-05 LAB — BASIC METABOLIC PANEL
ANION GAP: 8 (ref 5–15)
BUN: 10 mg/dL (ref 6–20)
CALCIUM: 7.8 mg/dL — AB (ref 8.9–10.3)
CO2: 27 mmol/L (ref 22–32)
CREATININE: 0.52 mg/dL (ref 0.44–1.00)
Chloride: 101 mmol/L (ref 101–111)
Glucose, Bld: 113 mg/dL — ABNORMAL HIGH (ref 65–99)
Potassium: 3.4 mmol/L — ABNORMAL LOW (ref 3.5–5.1)
SODIUM: 136 mmol/L (ref 135–145)

## 2016-01-05 LAB — CBC
HCT: 25.9 % — ABNORMAL LOW (ref 36.0–46.0)
Hemoglobin: 8.9 g/dL — ABNORMAL LOW (ref 12.0–15.0)
MCH: 34 pg (ref 26.0–34.0)
MCHC: 34.4 g/dL (ref 30.0–36.0)
MCV: 98.9 fL (ref 78.0–100.0)
PLATELETS: 268 10*3/uL (ref 150–400)
RBC: 2.62 MIL/uL — ABNORMAL LOW (ref 3.87–5.11)
RDW: 12.7 % (ref 11.5–15.5)
WBC: 7.1 10*3/uL (ref 4.0–10.5)

## 2016-01-05 LAB — MAGNESIUM: MAGNESIUM: 1.9 mg/dL (ref 1.7–2.4)

## 2016-01-05 MED ORDER — BISACODYL 10 MG RE SUPP
10.0000 mg | Freq: Once | RECTAL | Status: AC
  Administered 2016-01-05: 10 mg via RECTAL
  Filled 2016-01-05: qty 1

## 2016-01-05 MED ORDER — FLEET ENEMA 7-19 GM/118ML RE ENEM
1.0000 | ENEMA | Freq: Once | RECTAL | Status: AC
  Administered 2016-01-05: 1 via RECTAL
  Filled 2016-01-05: qty 1

## 2016-01-05 MED ORDER — SENNOSIDES-DOCUSATE SODIUM 8.6-50 MG PO TABS
1.0000 | ORAL_TABLET | Freq: Two times a day (BID) | ORAL | Status: DC
  Administered 2016-01-05 (×2): 1 via ORAL
  Filled 2016-01-05 (×3): qty 1

## 2016-01-05 MED ORDER — POTASSIUM CHLORIDE CRYS ER 20 MEQ PO TBCR
40.0000 meq | EXTENDED_RELEASE_TABLET | Freq: Once | ORAL | Status: AC
  Administered 2016-01-05: 40 meq via ORAL
  Filled 2016-01-05: qty 2

## 2016-01-05 NOTE — Evaluation (Signed)
Physical Therapy Evaluation Patient Details Name: [name] MRN: [id number] DOB: 06-22-1945 Today's Date: 01/05/2016   History of Present Illness  70 yo female adm after falling down  approximately 2 stairs and resulting inability to amb; xray=L supracondylar distal femur fx with intraarticular extension; pt underwent ORIF L  femur fx per Dr. Linna Caprice on 01/04/16  Clinical Impression  Pt admitted with above diagnosis. Pt currently with functional limitations due to the deficits listed below (see PT Problem List).  Pt will benefit from skilled PT to increase their independence and safety with mobility to allow discharge to the venue listed below.    Pt very pleasant, motivated and cooperative; Pt quite independent at her baseline, requiring ~mod assist for bed to chair transfers today; recommend SNF to allow pt to maximize independence prior to return home; Pt son is quite anxious regarding D/C plan, briefly discussed process but will defer to SW for further education.     Follow Up Recommendations SNF    Equipment Recommendations  None recommended by PT    Recommendations for Other Services       Precautions / Restrictions Precautions Precautions: Fall Required Braces or Orthoses: Other Brace/Splint Other Brace/Splint: Bledsoe brace at all times L LE Restrictions Weight Bearing Restrictions: Yes LLE Weight Bearing: Non weight bearing Other Position/Activity Restrictions: NWB for 12 wks per pt      Mobility  Bed Mobility Overal bed mobility: Needs Assistance Bed Mobility: Supine to Sit     Supine to sit: Min assist     General bed mobility comments: assist with LLE  Transfers Overall transfer level: Needs assistance Equipment used: Rolling walker (2 wheeled) Transfers: Sit to/from Stand Sit to Stand: Mod assist         General transfer comment: multi-modal cues for hand placement, LLE position, NWB once in standing; assist for anterior-superior translation adn  balance  Ambulation/Gait Ambulation/Gait assistance: Min assist;Mod assist Ambulation Distance (Feet): 3 Feet Assistive device: Rolling walker (2 wheeled)       General Gait Details: multi-modal cues for posture, use of UEs, NWB, RW position; pt able to take ~4 small pivotal steps to chair, able to maintain NWB with assist and cues  Stairs            Wheelchair Mobility    Modified Rankin (Stroke Patients Only)       Balance Overall balance assessment: Needs assistance (no prior falls ) Sitting-balance support: No upper extremity supported;Feet supported Sitting balance-Leahy Scale: Good     Standing balance support: Bilateral upper extremity supported;During functional activity Standing balance-Leahy Scale: Poor Standing balance comment: reliant on UEs for balance (NWB on LLE)                             Pertinent Vitals/Pain Pain Assessment: 0-10 Pain Score: 4  Pain Location: L knee Pain Descriptors / Indicators: Stabbing Pain Intervention(s): Limited activity within patient's tolerance;Monitored during session;Ice applied;Repositioned    Home Living Family/patient expects to be discharged to:: Skilled nursing facility Living Arrangements: Alone   Type of Home: House (townhouse) Home Access: Stairs to enter     Home Layout: Two level;Bed/bath upstairs;1/2 bath on main level Home Equipment: None      Prior Function Level of Independence: Independent               Hand Dominance        Extremity/Trunk Assessment   Upper Extremity Assessment: Defer to OT  evaluation           Lower Extremity Assessment: LLE deficits/detail   LLE Deficits / Details: ankle pf/df WFL, able to assist with SLR with Bledsoe in place, limited by post op pain     Communication   Communication: No difficulties  Cognition Arousal/Alertness: Awake/alert Behavior During Therapy: WFL for tasks assessed/performed Overall Cognitive Status: Within  Functional Limits for tasks assessed                      General Comments      Exercises General Exercises - Lower Extremity Ankle Circles/Pumps: AROM;Both;10 reps   Assessment/Plan    PT Assessment Patient needs continued PT services  PT Problem List Decreased strength;Decreased activity tolerance;Decreased balance;Decreased mobility;Decreased knowledge of precautions;Decreased knowledge of use of DME          PT Treatment Interventions DME instruction;Gait training;Functional mobility training;Therapeutic activities;Therapeutic exercise;Patient/family education    PT Goals (Current goals can be found in the Care Plan section)  Acute Rehab PT Goals Patient Stated Goal: return to independent lifestyle PT Goal Formulation: With patient Time For Goal Achievement: 01/12/16 Potential to Achieve Goals: Good    Frequency Min 3X/week   Barriers to discharge  decr caregiver support      Co-evaluation               End of Session Equipment Utilized During Treatment: Gait belt;Other (comment) (bledsoe brace LLE) Activity Tolerance: Patient tolerated treatment well Patient left: in chair;with call bell/phone within reach;with family/visitor present Nurse Communication: Mobility status         Time: [id number] PT Time Calculation (min) (ACUTE ONLY): 26 min   Charges:   PT Evaluation $PT Eval Low Complexity: 1 Procedure PT Treatments $Therapeutic Activity: 8-22 mins   PT G Codes:        [place] 01/05/2016, 10:31 AM  Drucilla Chaletara Jeryn Bertoni, PT Pager: [phone] 01/05/2016

## 2016-01-05 NOTE — Care Management Note (Signed)
Case Management Note  Patient Details  Name: Feliciana RossettiKarin Much MRN: [id number] Date of Birth: 02/03/1946  Subjective/Objective:                 Patient will DC to SNF. CSW consult placed this AM for assistance with placement.   Action/Plan:   Expected Discharge Date:  01/03/16               Expected Discharge Plan:  Skilled Nursing Facility  In-House Referral:  Clinical Social Work  Discharge planning Services  CM Consult  Post Acute Care Choice:  NA Choice offered to:  NA  DME Arranged:  N/A DME Agency:  NA  HH Arranged:  NA HH Agency:  NA  Status of Service:  Completed, signed off  If discussed at [employer] Length of Stay Meetings, dates discussed:    Additional Comments:  Lawerance SabalDebbie Mikie Misner, RN 01/05/2016, 12:33 PM

## 2016-01-05 NOTE — Progress Notes (Signed)
   Subjective:  Patient reports pain as mild to moderate.  Denies N/V/CP/SOB.  Objective:   VITALS:   Vitals:   01/04/16 0500 01/04/16 1500 01/04/16 2035 01/05/16 0539  BP: (!) 100/49 (!) 94/52 (!) 95/55 (!) 116/57  Pulse: 95 (!) 111 (!) 104 99  Resp: 16 16 16 16   Temp: 98 F (36.7 C) 98.2 F (36.8 C) 98.2 F (36.8 C) 99.2 F (37.3 C)  TempSrc: Oral Oral Oral Oral  SpO2: 95% 98% 96% 99%  Weight:      Height:        NAD Neurologically intact Sensation intact distally Intact pulses distally Dorsiflexion/Plantar flexion intact Incision: dressing C/D/I Compartment soft Bledsoe brace in place   Lab Results  Component Value Date   WBC 7.1 01/05/2016   HGB 8.9 (L) 01/05/2016   HCT 25.9 (L) 01/05/2016   MCV 98.9 01/05/2016   PLT 268 01/05/2016   BMET    Component Value Date/Time   NA 136 01/05/2016 0318   K 3.4 (L) 01/05/2016 0318   CL 101 01/05/2016 0318   CO2 27 01/05/2016 0318   GLUCOSE 113 (H) 01/05/2016 0318   BUN 10 01/05/2016 0318   CREATININE 0.52 01/05/2016 0318   CALCIUM 7.8 (L) 01/05/2016 0318   GFRNONAA >60 01/05/2016 0318   GFRAA >60 01/05/2016 0318     Assessment/Plan: 2 Days Post-Op   Principal Problem:   Hypokalemia Active Problems:   Femur fracture, right (HCC)   Osteoporosis   Closed displaced supracondylar fracture of distal end of left femur with intracondylar extension (HCC)   NWB LLE Cont knee immobilizer until patient is fitted for Bledsoe brace DVT ppx: lovenox x30 days, SCDs, TEDs PO pain control PT/OT Appreciate hospitalist input Dispo: D/C planning, likely SNF placement    Kalinda Romaniello, Cloyde ReamsBrian James 01/05/2016, 7:56 AM   Samson FredericBrian Savahna Casados, MD Cell [phone]

## 2016-01-05 NOTE — Evaluation (Signed)
Occupational Therapy Evaluation Patient Details Name: [name] MRN: [id number] DOB: Apr 20, 1945 Today's Date: 01/05/2016    History of Present Illness 70 yo female adm after falling down  approximately 2 stairs and resulting inability to amb; xray=L supracondylar distal femur fx with intraarticular extension; pt underwent ORIF L  femur fx per Dr. Linna Caprice on 01/04/16   Clinical Impression   PTA, pt was independent with all ADL and IADL and has had no previous falls. Pt currently requires max assist for LB ADL and mod assist for functional mobility. Pt lives alone and does not have family available for 24 hour assistance. Recommend D/C to SNF for short-term rehabilitation prior to D/C home. Pt would benefit from continued OT services while admitted to improve independence with LB ADL and ADL transfers. OT will continue to follow.    Follow Up Recommendations  SNF;Supervision/Assistance - 24 hour    Equipment Recommendations  Other (comment) (Defer to next venue of care.)       Precautions / Restrictions Precautions Precautions: Fall Required Braces or Orthoses: Other Brace/Splint Other Brace/Splint: Bledsoe brace at all times L LE Restrictions Weight Bearing Restrictions: Yes LLE Weight Bearing: Non weight bearing Other Position/Activity Restrictions: NWB for 12 wks per pt      Mobility Bed Mobility Overal bed mobility: Needs Assistance Bed Mobility: Supine to Sit     Supine to sit: Min assist     General bed mobility comments: OOB in chair on arrival.  Transfers Overall transfer level: Needs assistance Equipment used: Rolling walker (2 wheeled) Transfers: Sit to/from Stand Sit to Stand: Mod assist         General transfer comment: multi-modal cues for hand placement, LLE position, NWB once in standing; assist for anterior-superior translation adn balance    Balance Overall balance assessment: Needs assistance Sitting-balance support: No upper extremity  supported;Feet supported Sitting balance-Leahy Scale: Good     Standing balance support: Bilateral upper extremity supported;During functional activity Standing balance-Leahy Scale: Poor Standing balance comment: Unable to remove B UEs from RW for ADL.                            ADL Overall ADL's : Needs assistance/impaired     Grooming: Set up;Sitting   Upper Body Bathing: Set up;Sitting   Lower Body Bathing: Sit to/from stand;Maximal assistance   Upper Body Dressing : Set up;Sitting   Lower Body Dressing: Maximal assistance;Sit to/from stand   Toilet Transfer: Moderate assistance;BSC;RW Toilet Transfer Details (indicate cue type and reason): Simulated in room. Toileting- Clothing Manipulation and Hygiene: Minimal assistance;Sit to/from stand Toileting - Clothing Manipulation Details (indicate cue type and reason): Min assist for maintaining balance with dynamic standing.     Functional mobility during ADLs: Rolling walker;Moderate assistance General ADL Comments: Educated pt on dressing techniques, maintaining NWB status with ADL, and use of ice for pain management.     Vision Vision Assessment?: No apparent visual deficits          Pertinent Vitals/Pain Pain Assessment: Faces Pain Score: 4  Faces Pain Scale: Hurts little more Pain Location: L knee Pain Descriptors / Indicators: Stabbing Pain Intervention(s): Limited activity within patient's tolerance;Monitored during session;Ice applied;Repositioned     Hand Dominance Right   Extremity/Trunk Assessment Upper Extremity Assessment Upper Extremity Assessment: Overall WFL for tasks assessed   Lower Extremity Assessment Lower Extremity Assessment: LLE deficits/detail LLE Deficits / Details: ankle pf/df WFL, able to assist with SLR with [facility]  in place, limited by post op pain LLE: Unable to fully assess due to immobilization       Communication Communication Communication: No difficulties    Cognition Arousal/Alertness: Awake/alert Behavior During Therapy: WFL for tasks assessed/performed Overall Cognitive Status: Within Functional Limits for tasks assessed                                Home Living Family/patient expects to be discharged to:: Skilled nursing facility Living Arrangements: Alone   Type of Home: House Home Access: Stairs to enter     Home Layout: Two level;Bed/bath upstairs;1/2 bath on main level Alternate Level Stairs-Number of Steps: flight   Bathroom Shower/Tub: Tub/shower unit Shower/tub characteristics: Engineer, building servicesCurtain Bathroom Toilet: Standard     Home Equipment: None          Prior Functioning/Environment Level of Independence: Independent                 OT Problem List: Decreased strength;Decreased range of motion;Decreased activity tolerance;Impaired balance (sitting and/or standing);Decreased knowledge of use of DME or AE;Decreased knowledge of precautions;Pain;Decreased safety awareness   OT Treatment/Interventions: Self-care/ADL training;Therapeutic exercise;Therapeutic activities;Balance training;Patient/family education    OT Goals(Current goals can be found in the care plan section) Acute Rehab OT Goals Patient Stated Goal: return to independent lifestyle OT Goal Formulation: With patient/family Time For Goal Achievement: 01/19/16 Potential to Achieve Goals: Good ADL Goals Pt Will Perform Lower Body Bathing: sit to/from stand;with min guard assist Pt Will Perform Lower Body Dressing: with min guard assist;sit to/from stand Pt Will Transfer to Toilet: with min guard assist;ambulating;bedside commode Pt Will Perform Tub/Shower Transfer: with min guard assist;ambulating;rolling walker;3 in 1  OT Frequency: Min 1X/week    End of Session Equipment Utilized During Treatment: Gait belt;Rolling walker  Activity Tolerance: Patient tolerated treatment well Patient left: in chair;with call bell/phone within reach;with  family/visitor present   Time: [id number] OT Time Calculation (min): 23 min Charges:  OT General Charges $OT Visit: 1 Procedure OT Evaluation $OT Eval Moderate Complexity: 1 Procedure OT Treatments $Self Care/Home Management : 8-22 mins  Doristine SectionCharity A Aneesh Faller, OTR/L [phone] 01/05/2016, 12:06 PM

## 2016-01-05 NOTE — [place] FL2 (Signed)
[place] MEDICAID FL2 LEVEL OF CARE SCREENING TOOL     IDENTIFICATION  Patient Name: [name] Birthdate: 06-18-1945 Sex: female Admission Date (Current Location): 12/31/2015  [facility] [place] and [place] Number:  Producer, television/film/video and Address:  [facility] [place], [address] [place], [place] [address]      Provider Number: [id number]  Attending Physician Name and Address:  Samson Frederic, MD  Relative Name and Phone Number:       Current Level of Care: Hospital Recommended Level of Care: Skilled Nursing Facility Prior Approval Number:    Date Approved/Denied:   PASRR Number: [id number]  Discharge Plan: SNF    Current Diagnoses: Patient Active Problem List   Diagnosis Date Noted  . Closed fracture of left distal femur (HCC)   . Hypomagnesemia   . Closed displaced supracondylar fracture of distal end of left femur with intracondylar extension (HCC) 01/03/2016  . Femur fracture, right (HCC) 12/31/2015  . Hypokalemia 12/31/2015  . Osteoporosis 12/31/2015    Orientation RESPIRATION BLADDER Height & Weight     Self, Time, Situation  Normal   Weight: 105 lb 9.6 oz (47.9 kg) Height:  5\' 3"  (160 cm)  BEHAVIORAL SYMPTOMS/MOOD NEUROLOGICAL BOWEL NUTRITION STATUS           AMBULATORY STATUS COMMUNICATION OF NEEDS Skin   Limited Assist Verbally Surgical wounds                       Personal Care Assistance Level of Assistance  Bathing, Dressing Bathing Assistance: Limited assistance   Dressing Assistance: Limited assistance     Functional Limitations Info             SPECIAL CARE FACTORS FREQUENCY  PT (By licensed PT), OT (By licensed OT)     PT Frequency: daily OT Frequency: daily            Contractures Contractures Info: Not present    Additional Factors Info  Code Status Code Status Info: FULL CODE             Current Medications (01/05/2016):  This is the current hospital active medication list Current  Facility-Administered Medications  Medication Dose Route Frequency Provider Last Rate Last Dose  . acetaminophen (TYLENOL) tablet 650 mg  650 mg Oral Q6H PRN Samson Frederic, MD       Or  . acetaminophen (TYLENOL) suppository 650 mg  650 mg Rectal Q6H PRN Samson Frederic, MD      . bisacodyl (DULCOLAX) suppository 10 mg  10 mg Rectal Once Alison Murray, MD      . diphenhydrAMINE (BENADRYL) capsule 25 mg  25 mg Oral Q6H PRN Samson Frederic, MD      . enoxaparin (LOVENOX) injection 40 mg  40 mg Subcutaneous Q24H Samson Frederic, MD   40 mg at 01/05/16 1117  . HYDROmorphone (DILAUDID) injection 0.5 mg  0.5 mg Intravenous Q2H PRN Samson Frederic, MD      . lactated ringers infusion   Intravenous Continuous Samson Frederic, MD 50 mL/hr at 01/03/16 1437    . menthol-cetylpyridinium (CEPACOL) lozenge 3 mg  1 lozenge Oral PRN Samson Frederic, MD       Or  . phenol (CHLORASEPTIC) mouth spray 1 spray  1 spray Mouth/Throat PRN Samson Frederic, MD      . metoCLOPramide (REGLAN) tablet 5-10 mg  5-10 mg Oral Q8H PRN Samson Frederic, MD       Or  . metoCLOPramide (REGLAN)  injection 5-10 mg  5-10 mg Intravenous Q8H PRN Samson FredericBrian Jamicheal Heard, MD      . ondansetron [facility](ZOFRAN) tablet 4 mg  4 mg Oral Q6H PRN Samson FredericBrian Mayumi Summerson, MD       Or  . ondansetron [facility](ZOFRAN) injection 4 mg  4 mg Intravenous Q6H PRN Samson FredericBrian Felma Pfefferle, MD      . oxyCODONE (Oxy IR/ROXICODONE) immediate release tablet 5-10 mg  5-10 mg Oral Q4H PRN Venita Lickahari Brooks, MD   10 mg at 01/05/16 1116  . polyethylene glycol (MIRALAX / GLYCOLAX) packet 17 g  17 g Oral Daily Belkys A Regalado, MD   17 g at 01/05/16 1117  . senna-docusate (Senokot-S) tablet 1 tablet  1 tablet Oral BID Alison MurrayAlma M Devine, MD   1 tablet at 01/05/16 1117     Discharge Medications: Please see discharge summary for a list of discharge medications.  Relevant Imaging Results:  Relevant Lab Results:   Additional Information SS# [id number]  Rondel Batonngle, ReGina C, LCSW

## 2016-01-05 NOTE — Progress Notes (Signed)
Patient ID: [name], female   DOB: January 23, 1946, 70 y.o.   MRN: [id number]  PROGRESS NOTE    [name]  NFA:[id number] DOB: [birth year] DOA: 12/31/2015  PCP: Carson Myrtle, MD   Brief Narrative:   70 y.o.femalewith medical history significant for osteoporosis who presented to [facility] status post fall while walking down the stairs. Patient was found to have distal left femur fracture. She underwent open reduction and internal fixation of the distal femur fracture. TRH consulted for preoperative assessment and clearance.  Assessment & Plan:  Left femur fracture - Status post ORIF - Management per primary  Hypokalemia / Hypomagensemia - Continue to supplement potassium. Magnesium was within normal limits this morning  Abnormal EKG - Q wave V 1, V 2.  - 2-D echo with normal ejection fraction  DVT prophylaxis: Lovenox subcutaneous Code Status: full code  Family Communication: Son at the bedside Disposition Plan: per primary   - TRH will sign off, for any questions please call [phone] or pager [phone]  Manson Passey [facility] - Thank you    Consultants:   TRH  Procedures:   S/P open reduction internal Fixation distal femur fracture.   Antimicrobials:   None    Subjective: No overnight events.   Objective: Vitals:   01/04/16 0500 01/04/16 1500 01/04/16 2035 01/05/16 0539  BP: (!) 100/49 (!) 94/52 (!) 95/55 (!) 116/57  Pulse: 95 (!) 111 (!) 104 99  Resp: 16 16 16 16   Temp: 98 F (36.7 C) 98.2 F (36.8 C) 98.2 F (36.8 C) 99.2 F (37.3 C)  TempSrc: Oral Oral Oral Oral  SpO2: 95% 98% 96% 99%  Weight:      Height:        Intake/Output Summary (Last 24 hours) at 01/05/16 1046 Last data filed at 01/04/16 1900  Gross per 24 hour  Intake              480 ml  Output              250 ml  Net              230 ml   Filed Weights   12/31/15 2358 01/01/16 0617  Weight: 54.4 kg (120 lb) 47.9 kg (105 lb 9.6 oz)    Examination:  General  exam: Appears calm and comfortable  Respiratory system: Clear to auscultation. Respiratory effort normal. Cardiovascular system: S1 & S2 heard, Rate controlled  Gastrointestinal system: Abdomen is nondistended, soft and nontender. No organomegaly or masses felt. Normal bowel sounds heard. Central nervous system: No focal neurological deficits. Extremities: Palpable pulses, no swelling  Skin: No rashes, lesions or ulcers Psychiatry: Judgement and insight appear normal. Mood & affect appropriate.   Data Reviewed: I have personally reviewed following labs and imaging studies  CBC:  Recent Labs Lab 12/31/15 1625 01/01/16 0736 01/03/16 0935 01/04/16 0611 01/04/16 1236 01/05/16 0318  WBC 8.8 11.0* 10.1 8.3 9.3 7.1  NEUTROABS 7.1  --   --   --   --   --   HGB 13.6 13.0 12.1 9.9* 9.8* 8.9*  HCT 39.2 38.5 34.7* 29.1* 28.7* 25.9*  MCV 100.0 100.3* 99.4 100.0 100.3* 98.9  PLT 290 239 229 250 281 268   Basic Metabolic Panel:  Recent Labs Lab 01/01/16 0736 01/02/16 0349 01/02/16 0737 01/03/16 0935 01/04/16 0611 01/04/16 1236 01/05/16 0318  NA 138 134*  --  136 136  --  136  K 3.7 5.0  --  3.5 4.0  --  3.4*  CL 106 104  --  103 104  --  101  CO2 20* 24  --  20* 20*  --  27  GLUCOSE 103* 111*  --  79 110*  --  113*  BUN 9 5*  --  7 11  --  10  CREATININE 0.60 0.61  --  0.64 0.69 0.71 0.52  CALCIUM 8.7* 8.5*  --  8.2* 7.8*  --  7.8*  MG  --   --  1.4*  --  1.6*  --  1.9   GFR: Estimated Creatinine Clearance: 48.1 mL/min (by C-G formula based on SCr of 0.52 mg/dL). Liver Function Tests: No results for input(s): AST, ALT, ALKPHOS, BILITOT, PROT, ALBUMIN in the last 168 hours. No results for input(s): LIPASE, AMYLASE in the last 168 hours. No results for input(s): AMMONIA in the last 168 hours. Coagulation Profile:  Recent Labs Lab 12/31/15 1625  INR 0.90   Cardiac Enzymes: No results for input(s): CKTOTAL, CKMB, CKMBINDEX, TROPONINI in the last 168 hours. BNP (last 3  results) No results for input(s): PROBNP in the last 8760 hours. HbA1C: No results for input(s): HGBA1C in the last 72 hours. CBG: No results for input(s): GLUCAP in the last 168 hours. Lipid Profile: No results for input(s): CHOL, HDL, LDLCALC, TRIG, CHOLHDL, LDLDIRECT in the last 72 hours. Thyroid Function Tests: No results for input(s): TSH, T4TOTAL, FREET4, T3FREE, THYROIDAB in the last 72 hours. Anemia Panel: No results for input(s): VITAMINB12, FOLATE, FERRITIN, TIBC, IRON, RETICCTPCT in the last 72 hours. Urine analysis:    Component Value Date/Time   COLORURINE YELLOW 12/31/2015 2141   APPEARANCEUR CLOUDY (A) 12/31/2015 2141   LABSPEC 1.022 12/31/2015 2141   PHURINE 6.0 12/31/2015 2141   GLUCOSEU NEGATIVE 12/31/2015 2141   HGBUR NEGATIVE 12/31/2015 2141   BILIRUBINUR SMALL (A) 12/31/2015 2141   KETONESUR 15 (A) 12/31/2015 2141   PROTEINUR NEGATIVE 12/31/2015 2141   NITRITE NEGATIVE 12/31/2015 2141   LEUKOCYTESUR NEGATIVE 12/31/2015 2141   Sepsis Labs: @LABRCNTIP (procalcitonin:4,lacticidven:4)   Recent Results (from the past 240 hour(s))  Surgical PCR screen     Status: None   Collection Time: 01/01/16 12:48 PM  Result Value Ref Range Status   MRSA, PCR NEGATIVE NEGATIVE Final   Staphylococcus aureus NEGATIVE NEGATIVE Final    Comment:        The Xpert SA Assay (FDA approved for NASAL specimens in patients over 23 years of age), is one component of a comprehensive surveillance program.  Test performance has been validated by [facility] for patients greater than or equal to 14 year old. It is not intended to diagnose infection nor to guide or monitor treatment.       Radiology Studies: Dg Pelvis Portable Result Date: 01/01/2016 No acute finding. Electronically Signed   By: Marnee Spring M.D.   On: 01/01/2016 14:54   Dg C-arm 61-120 Min Result Date: 01/03/2016 Status post surgical internal fixation of distal left femoral fracture. Electronically  Signed   By: Lupita Raider, M.D.   On: 01/03/2016 21:00   Dg Femur Min 2 Views Left Result Date: 01/03/2016 Status post surgical internal fixation of distal left femoral fracture. Electronically Signed   By: Lupita Raider, M.D.   On: 01/03/2016 21:00   Dg Femur Port Min 2 Views Left Result Date: 01/03/2016 Good appearance following ORIF. Electronically Signed   By: Paulina Fusi M.D.   On: 01/03/2016 21:09      Scheduled Meds: .  bisacodyl  10 mg Rectal Once  . enoxaparin (LOVENOX) injection  40 mg Subcutaneous Q24H  . polyethylene glycol  17 g Oral Daily  . potassium chloride  40 mEq Oral Once  . senna-docusate  1 tablet Oral BID   Continuous Infusions: . lactated ringers 50 mL/hr at 01/03/16 1437     LOS: 5 days    Time spent: 15 minutes  Greater than 50% of the time spent on counseling and coordinating the care.   Manson PasseyEVINE, Nadiah Corbit, MD [facility] Pager [phone]  If 7PM-7AM, please contact night-coverage [email or web address] Password [email or web address] 01/05/2016, 10:46 AM

## 2016-01-05 NOTE — Discharge Summary (Signed)
Physician Discharge Summary  Patient ID: [name] Cardosa MRN: [id number] DOB/AGE: 72/10/1945 72 y.o.  Admit date: 12/31/2015 Discharge date: 01/06/2016  Admission Diagnoses:  Hypokalemia  Discharge Diagnoses:  Principal Problem:   Hypokalemia Active Problems:   Femur fracture, right (HCC)   Osteoporosis   Closed displaced supracondylar fracture of distal end of left femur with intracondylar extension (HCC)   Closed fracture of left distal femur (HCC)   Hypomagnesemia   History reviewed. No pertinent past medical history.  Surgeries: Procedure(s): OPEN REDUCTION INTERNAL FIXATION (ORIF) DISTAL FEMUR FRACTURE on 01/03/2016   Consultants (if any):   Discharged Condition: Improved  Hospital Course: [name] Zirbes is an 70 y.o. female who was admitted 12/31/2015 with a diagnosis of Hypokalemia and went to the operating room on 01/03/2016 and underwent the above named procedures.    She was given perioperative antibiotics:  Anti-infectives    Start     Dose/Rate Route Frequency Ordered Stop   01/04/16 1400  ceFAZolin (ANCEF) IVPB 2g/100 mL premix     2 g 200 mL/hr over 30 Minutes Intravenous Every 6 hours 01/04/16 1231 01/04/16 2018   01/02/16 0900  ceFAZolin (ANCEF) IVPB 2g/100 mL premix     2 g 200 mL/hr over 30 Minutes Intravenous To Short Stay 01/02/16 0725 01/03/16 0900    .  She was given sequential compression devices, early ambulation, and lovenox for DVT prophylaxis. She was made non weight bearing left leg. A hinged knee brace was placed, locked in extension.  She benefited maximally from the hospital stay and there were no complications.    Recent vital signs:  Vitals:   01/05/16 1930 01/06/16 0543  BP: 118/65 134/76  Pulse: (!) 119 89  Resp: 17 17  Temp: 98.5 F (36.9 C) 98 F (36.7 C)    Recent laboratory studies:  Lab Results  Component Value Date   HGB 9.2 (L) 01/06/2016   HGB 8.9 (L) 01/05/2016   HGB 9.8 (L) 01/04/2016   Lab Results  Component  Value Date   WBC 7.3 01/06/2016   PLT 277 01/06/2016   Lab Results  Component Value Date   INR 0.90 12/31/2015   Lab Results  Component Value Date   NA 136 01/06/2016   K 3.3 (L) 01/06/2016   CL 101 01/06/2016   CO2 26 01/06/2016   BUN 8 01/06/2016   CREATININE 0.50 01/06/2016   GLUCOSE 105 (H) 01/06/2016    Discharge Medications:     Medication List    STOP taking these medications   alendronate 70 MG tablet Commonly known as:  FOSAMAX     TAKE these medications   enoxaparin 40 MG/0.4ML injection Commonly known as:  LOVENOX Inject 0.4 mLs (40 mg total) into the skin daily.   MULTIVITAMIN ADULT PO Take 1 tablet by mouth daily.   oxyCODONE 5 MG immediate release tablet Commonly known as:  Oxy IR/ROXICODONE Take 1-2 tablets (5-10 mg total) by mouth every 4 (four) hours as needed for breakthrough pain ((for MODERATE breakthrough pain)).       Diagnostic Studies: Ct Knee Left Wo Contrast  Result Date: 12/31/2015 CLINICAL DATA:  Evaluate left knee fracture.  Initial encounter. EXAM: CT OF THE LEFT KNEE WITHOUT CONTRAST TECHNIQUE: Multidetector CT imaging of the left knee was performed according to the standard protocol. Multiplanar CT image reconstructions were also generated. COMPARISON:  Left knee radiographs performed earlier today at 3:11 p.m. FINDINGS: Bones/Joint/Cartilage There is a comminuted and displaced fracture of the distal femur, with  a large posteriorly, proximally and laterally displaced lateral condylar fragment, and an additional mildly displaced fracture line extending across the medial femoral metadiaphysis. The larger medial condylar fragment demonstrates mild posterior displacement and angulation. No additional fractures are seen. There is diffuse chondrocalcinosis at the joint space, and moderate hemarthrosis is noted, with blood tracking throughout Hoffa's fat pad. The cartilage is not well assessed on CT. Ligaments The posterior cruciate ligament  appears intact. The anterior cruciate ligament is not well assessed on CT. The medial collateral ligament and lateral collateral ligament complex are difficult to fully assess. Muscles and Tendons The visualized musculature is grossly unremarkable. The quadriceps and patellar tendons remain intact. Soft tissues The vasculature is not well assessed. No additional soft tissue abnormalities are seen. IMPRESSION: 1. Comminuted and displaced fracture of the distal femur, with a large posteriorly, proximally and laterally displaced lateral condylar fragment, and additional mildly displaced fracture line extending across the medial femoral metadiaphysis. The larger medial condylar fragment demonstrates mild posterior displacement and angulation. 2. Moderate hemarthrosis, with blood tracking throughout Hoffa's fat pad. 3. Diffuse chondrocalcinosis at the joint space. Electronically Signed   By: Roanna Raider M.D.   On: 12/31/2015 18:46   Dg Pelvis Portable  Result Date: 01/01/2016 CLINICAL DATA:  Closed displaced supracondylar fracture of the left femur. EXAM: PORTABLE PELVIS 1-2 VIEWS COMPARISON:  06/29/2005 acute abdominal series FINDINGS: No evidence of fracture or diastasis. Both hips are located and appear intact. Osteopenia and atherosclerosis. L4-5 asymmetric right disc narrowing. IMPRESSION: No acute finding. Electronically Signed   By: Marnee Spring M.D.   On: 01/01/2016 14:54   Chest Portable 1 View  Result Date: 01/01/2016 CLINICAL DATA:  71 y/o  F; left distal femur fracture. EXAM: PORTABLE CHEST 1 VIEW COMPARISON:  09/04/2011 chest radiograph FINDINGS: Stable cardiac silhouette given differences in technique. Aortic atherosclerosis with arch calcification. Minor biapical pleural parenchymal scarring. No consolidation. No pneumothorax or effusion. Degenerative changes of the thoracic spine and mild dextro curvature upper thoracic spine. IMPRESSION: No active disease. Electronically Signed   By:  Mitzi Hansen M.D.   On: 01/01/2016 00:51   Dg Knee Complete 4 Views Left  Result Date: 12/31/2015 CLINICAL DATA:  Pt c/o severe knee pain today w/ obv deformity s/p slipped on bottom two steps at her son's house today, landing on knee. EXAM: LEFT KNEE - COMPLETE 4+ VIEW COMPARISON:  None. FINDINGS: Significantly displaced fracture of the distal left femur, with avulsion of a large portion of the lateral femoral condyle, with probable additional impaction at the fracture site. Suspect additional minimally displaced fracture within the medial femoral condyle. No obvious fracture seen within the proximal tibia or fibula. IMPRESSION: 1. Significantly displaced fracture of the distal left femur, with avulsion and rotation of a large portion of the lateral femoral condyle, with probable additional impaction at the fracture site. 2. Suspect additional minimally displaced fracture within the medial femoral condyle. Electronically Signed   By: Bary Richard M.D.   On: 12/31/2015 15:37   Dg C-arm 61-120 Min  Result Date: 01/03/2016 CLINICAL DATA:  Open reduction and internal fixation of left distal femur fracture. EXAM: LEFT FEMUR 2 VIEWS; DG C-ARM 61-120 MIN Fluoroscopy time:  2 minutes 25 seconds. COMPARISON:  Radiographs of December 31, 2015. FINDINGS: Four intraoperative fluoroscopic images retained at the distal left femur. These images demonstrate internal fixation of distal left femoral fracture. Good alignment of fracture components is noted. IMPRESSION: Status post surgical internal fixation of distal left femoral fracture.  Electronically Signed   By: Lupita Raider, M.D.   On: 01/03/2016 21:00   Dg Femur Min 2 Views Left  Result Date: 01/03/2016 CLINICAL DATA:  Open reduction and internal fixation of left distal femur fracture. EXAM: LEFT FEMUR 2 VIEWS; DG C-ARM 61-120 MIN Fluoroscopy time:  2 minutes 25 seconds. COMPARISON:  Radiographs of December 31, 2015. FINDINGS: Four intraoperative  fluoroscopic images retained at the distal left femur. These images demonstrate internal fixation of distal left femoral fracture. Good alignment of fracture components is noted. IMPRESSION: Status post surgical internal fixation of distal left femoral fracture. Electronically Signed   By: Lupita Raider, M.D.   On: 01/03/2016 21:00   Dg Femur Port Min 2 Views Left  Result Date: 01/03/2016 CLINICAL DATA:  Postop ORIF. EXAM: LEFT FEMUR PORTABLE 2 VIEWS COMPARISON:  Intra operative images same day FINDINGS: There is plate and screw fixation of a comminuted distal femur fracture. Near anatomic restoration. Articular surfaces appear restored. IMPRESSION: Good appearance following ORIF. Electronically Signed   By: Paulina Fusi M.D.   On: 01/03/2016 21:09    Disposition:   Discharge Instructions    Bed to Chair Transfer    Complete by:  As directed    Call MD / Call 911    Complete by:  As directed    If you experience chest pain or shortness of breath, CALL 911 and be transported to the hospital emergency room.  If you develope a fever above 101 F, pus (white drainage) or increased drainage or redness at the wound, or calf pain, call your surgeon's office.   Constipation Prevention    Complete by:  As directed    Drink plenty of fluids.  Prune juice may be helpful.  You may use a stool softener, such as Colace (over the counter) 100 mg twice a day.  Use MiraLax (over the counter) for constipation as needed.   Diet - low sodium heart healthy    Complete by:  As directed    Discharge instructions    Complete by:  As directed    Non weight bearing left leg bledsoe brace at all times May unlock brace for ROM (0-90 deg) with PT   Discharge wound care:    Complete by:  As directed    If you have a hip bandage, keep it clean and dry.  Change your bandage as instructed by your health care providers.  If your bandage has been discontinued, keep your incision clean and dry.  Pat dry after bathing.  DO  NOT put lotion or powder on your incision.   Non weight bearing    Complete by:  As directed       Follow-up Information    Gracianna Vink, Cloyde Reams, MD. Schedule an appointment as soon as possible for a visit in 2 weeks.   Specialty:  Orthopedic Surgery Why:  For wound re-check Contact information: [address] [place] [address] [phone]            Signed: Garnet Koyanagi 01/06/2016, 10:44 AM

## 2016-01-06 LAB — CBC
HEMATOCRIT: 27.2 % — AB (ref 36.0–46.0)
HEMOGLOBIN: 9.2 g/dL — AB (ref 12.0–15.0)
MCH: 33.9 pg (ref 26.0–34.0)
MCHC: 33.8 g/dL (ref 30.0–36.0)
MCV: 100.4 fL — ABNORMAL HIGH (ref 78.0–100.0)
Platelets: 277 10*3/uL (ref 150–400)
RBC: 2.71 MIL/uL — ABNORMAL LOW (ref 3.87–5.11)
RDW: 12.8 % (ref 11.5–15.5)
WBC: 7.3 10*3/uL (ref 4.0–10.5)

## 2016-01-06 LAB — BASIC METABOLIC PANEL
Anion gap: 9 (ref 5–15)
BUN: 8 mg/dL (ref 6–20)
CALCIUM: 7.9 mg/dL — AB (ref 8.9–10.3)
CHLORIDE: 101 mmol/L (ref 101–111)
CO2: 26 mmol/L (ref 22–32)
Creatinine, Ser: 0.5 mg/dL (ref 0.44–1.00)
GFR calc Af Amer: >60 mL/min (ref >60)
GFR calc non Af Amer: >60 mL/min (ref >60)
Glucose, Bld: 105 mg/dL — ABNORMAL HIGH (ref 65–99)
Potassium: 3.3 mmol/L — ABNORMAL LOW (ref 3.5–5.1)
Sodium: 136 mmol/L (ref 135–145)

## 2016-01-06 NOTE — Clinical Social Work Note (Signed)
Patient will discharge today per MD order. Patient will [facility]: [facility] RN to call report prior to transportation to: [phone] Transportation: [facility]- to be scheduled after insurance authorization received.  [facility].   CSW sent discharge summary to SNF for review.    Vickii PennaGina Anasofia Micallef, MSW, LCSW  [phone]  Licensed Clinical Social Worker

## 2016-01-06 NOTE — Clinical Social Work Placement (Signed)
   CLINICAL SOCIAL WORK PLACEMENT  NOTE  Date:  01/06/2016  Patient Details  Name: [name] MRN: [id number] Date of Birth: 05/30/1945  Clinical Social Work is seeking post-discharge placement for this patient at the Skilled  Nursing Facility level of care (*CSW will initial, date and re-position this form in  chart as items are completed):  Yes   Patient/family provided with [facility] Clinical Social Work Department's list of facilities offering this level of care within the geographic area requested by the patient (or if unable, by the patient's family).  Yes   Patient/family informed of their freedom to choose among providers that offer the needed level of care, that participate in Medicare, Medicaid or managed care program needed by the patient, have an available bed and are willing to accept the patient.  Yes   Patient/family informed of [facility] ownership interest in [facility] and [facility], as well as of the fact that they are under no obligation to receive care at these facilities.  PASRR submitted to EDS on 01/05/16     PASRR number received on 01/05/16     Existing PASRR number confirmed on       FL2 transmitted to all facilities in geographic area requested by pt/family on 01/05/16     FL2 transmitted to all facilities within larger geographic area on       Patient informed that his/her managed care company has contracts with or will negotiate with certain facilities, including the following:        Yes   Patient/family informed of bed offers received.  [facility]     Physician recommends and patient chooses bed at      [facility] 01/06/[facility].  Patient to be transferred to facility by [facility]     Patient family notified on 01/06/16 of transfer.  Name of family member notified:  son Maisie Fushomas     PHYSICIAN Please prepare priority discharge summary, including medications     Additional  Comment:    _______________________________________________ Rondel BatonIngle, Jocob Dambach C, LCSW 01/06/2016, 10:27 AM

## 2016-01-06 NOTE — Progress Notes (Signed)
[facility] but was given a go around. First called and asked to give report and was told to speak to the supervisor on a [phone]. Called that number and spoke to [place] whom is the supervisor. Archie Pattenonya indicated that, she is not the nurse taking the patient so, she will want me to call back on the main number and [facility]. [facility] and [facility]. Spoke to [place] and she said, she is not the nurse going to care for the patient and therefore was going to transfer me to another person. The phone rang for so long and it eventually went gave a busy signal.

## 2016-01-06 NOTE — Progress Notes (Signed)
   Subjective:  Patient reports pain as mild to moderate.  Denies N/V/CP/SOB.  Objective:   VITALS:   Vitals:   01/05/16 0539 01/05/16 1419 01/05/16 1930 01/06/16 0543  BP: (!) 116/57 94/60 118/65 134/76  Pulse: 99 (!) 103 (!) 119 89  Resp: 16 17 17 17   Temp: 99.2 F (37.3 C) 98.9 F (37.2 C) 98.5 F (36.9 C) 98 F (36.7 C)  TempSrc: Oral Oral Oral Oral  SpO2: 99% 99% 96% 96%  Weight:      Height:        NAD Neurologically intact Sensation intact distally Intact pulses distally Dorsiflexion/Plantar flexion intact Incision: dressing C/D/I Compartment soft Bledsoe brace in place   Lab Results  Component Value Date   WBC 7.1 01/05/2016   HGB 8.9 (L) 01/05/2016   HCT 25.9 (L) 01/05/2016   MCV 98.9 01/05/2016   PLT 268 01/05/2016   BMET    Component Value Date/Time   NA 136 01/06/2016 0629   K 3.3 (L) 01/06/2016 0629   CL 101 01/06/2016 0629   CO2 26 01/06/2016 0629   GLUCOSE 105 (H) 01/06/2016 0629   BUN 8 01/06/2016 0629   CREATININE 0.50 01/06/2016 0629   CALCIUM 7.9 (L) 01/06/2016 0629   GFRNONAA >60 01/06/2016 0629   GFRAA >60 01/06/2016 0629     Assessment/Plan: 3 Days Post-Op   Principal Problem:   Hypokalemia Active Problems:   Femur fracture, right (HCC)   Osteoporosis   Closed displaced supracondylar fracture of distal end of left femur with intracondylar extension (HCC)   Closed fracture of left distal femur (HCC)   Hypomagnesemia   NWB LLE Cont knee immobilizer until patient is fitted for Bledsoe brace DVT ppx: lovenox x30 days, SCDs, TEDs PO pain control PT/OT Appreciate hospitalist input Dispo: D/C planning, SNF placement    Jordyne Poehlman, Cloyde ReamsBrian James 01/06/2016, 7:47 AM   Samson FredericBrian Keyleigh Manninen, MD Cell [phone]

## 2016-01-06 NOTE — Progress Notes (Signed)
Physical Therapy Treatment Patient Details Name: Heidi RossettiKarin Boyd MRN: [id number] DOB: 02/07/1946 Today's Date: 01/06/2016    History of Present Illness 70 yo female adm after falling down  approximately 2 stairs and resulting inability to amb; xray=L supracondylar distal femur fx with intraarticular extension; pt underwent ORIF L  femur fx per Dr. Linna CapriceSwinteck on 01/04/16    PT Comments    Pt performed supine exercises and repositioned in bed for comfort.  Pt to d/c to SNF today.    Follow Up Recommendations  SNF     Equipment Recommendations  None recommended by PT    Recommendations for Other Services       Precautions / Restrictions Precautions Precautions: Fall Required Braces or Orthoses: Other Brace/Splint Other Brace/Splint: Bledsoe brace at all times L LE Restrictions Weight Bearing Restrictions: Yes LLE Weight Bearing: Non weight bearing Other Position/Activity Restrictions: NWB for 12 wks per pt    Mobility  Bed Mobility Overal bed mobility: Needs Assistance             General bed mobility comments: Pt required +2 assist to boost [facility] for comfort.     Transfers Overall transfer level:  (Pt reports getting up and down frequently to toilet and wanting to stay in the bed.  )                  Ambulation/Gait                 Stairs            Wheelchair Mobility    Modified Rankin (Stroke Patients Only)       Balance                                    Cognition Arousal/Alertness: Awake/alert Behavior During Therapy: WFL for tasks assessed/performed Overall Cognitive Status: Within Functional Limits for tasks assessed                      Exercises Total Joint Exercises Ankle Circles/Pumps: AROM;Both;10 reps;Supine Quad Sets: AROM;Both;10 reps;Supine Heel Slides: AROM;Right;10 reps;Supine Hip ABduction/ADduction: AROM;Both;10 reps;Supine;AAROM (AAROM with LLE) Straight Leg  Raises: AROM;Both;10 reps;Supine;AAROM (AAROM with LLE)    General Comments        Pertinent Vitals/Pain Pain Assessment: Faces Faces Pain Scale: Hurts a little bit Pain Location: L LE Pain Descriptors / Indicators: Discomfort Pain Intervention(s): Monitored during session;Repositioned    Home Living                      Prior Function            PT Goals (current goals can now be found in the care plan section) Acute Rehab PT Goals Patient Stated Goal: to wash her hair Potential to Achieve Goals: Good Progress towards PT goals: Progressing toward goals    Frequency    Min 3X/week      PT Plan Current plan remains appropriate    Co-evaluation             End of Session   Activity Tolerance: Patient tolerated treatment well Patient left: in bed;with call bell/phone within reach     Time: 1421-1432 PT Time Calculation (min) (ACUTE ONLY): 11 min  Charges:  $Therapeutic Exercise: 8-22 mins  G Codes:      Florestine Avers 01/06/2016, 2:40 PM  Joycelyn Rua, PTA pager [phone]

## 2016-01-06 NOTE — Clinical Social Work Note (Signed)
Clinical Social Work Assessment  Patient Details  Name: [name] MRN: [id number] Date of Birth: 1945/11/02  Date of referral:  01/06/16               Reason for consult:  Facility Placement                Permission sought to share information with:    Permission granted to share information::  Yes, Verbal Permission Granted  Name::      Marcello Moores)  Agency::   (SNfs)  Relationship::   (son)  Contact Information:     Housing/Transportation Living arrangements for the past 2 months:  Single Family Home Source of Information:  Patient, Adult Children Patient Interpreter Needed:  None Criminal Activity/Legal Involvement Pertinent to Current Situation/Hospitalization:  No - Comment as needed Significant Relationships:  Adult Children Lives with:  Self Do you feel safe going back to the place where you live?  No Need for family participation in patient care:  Yes (Comment) (patient request)  Care giving concerns:  No caregiving concerns offered   Facilities manager / plan:  CSW met with patient who states she would like for me to review discharge plans with her son.  CSW contacted Marcello Moores, son via phone.  Marcello Moores has chosen [employer] and states that patient will need PTAR once discharged.  CSW will arrange.  Employment status:  Retired Nurse, adult PT Recommendations:  [place] / Referral to community resources:  [place]  Patient/Family's Response to care:  Agreeable to SNF  Patient/Family's Understanding of and Emotional Response to Diagnosis, Current Treatment, and Prognosis:  Patient was calm and offered an ease of attitude toward the STR transition.  The son exhibited moderate anxiety with his new role as caregiver in this transition to SNF STR.  CSW offered support and education to assist with this transition.  Emotional Assessment Appearance:  Appears stated age Attitude/Demeanor/Rapport:     Affect (typically observed):  Accepting, Adaptable Orientation:  Oriented to Self, Oriented to Place, Oriented to  Time, Oriented to Situation Alcohol / Substance use:  Not Applicable Psych involvement (Current and /or in the community):  No (Comment)  Discharge Needs  Concerns to be addressed:  No discharge needs identified Readmission within the last 30 days:  No Current discharge risk:  None Barriers to Discharge:  [place], LCSW 01/06/2016, 10:24 AM

## 2016-01-09 ENCOUNTER — Encounter: Payer: Self-pay | Admitting: Adult Health

## 2016-01-09 ENCOUNTER — Non-Acute Institutional Stay (SKILLED_NURSING_FACILITY): Payer: Medicare Other | Admitting: Adult Health

## 2016-01-09 DIAGNOSIS — K5901 Slow transit constipation: Secondary | ICD-10-CM | POA: Diagnosis not present

## 2016-01-09 DIAGNOSIS — E876 Hypokalemia: Secondary | ICD-10-CM | POA: Diagnosis not present

## 2016-01-09 DIAGNOSIS — S72462S Displaced supracondylar fracture with intracondylar extension of lower end of left femur, sequela: Secondary | ICD-10-CM

## 2016-01-09 DIAGNOSIS — R2681 Unsteadiness on feet: Secondary | ICD-10-CM

## 2016-01-09 DIAGNOSIS — D62 Acute posthemorrhagic anemia: Secondary | ICD-10-CM | POA: Diagnosis not present

## 2016-01-09 NOTE — Progress Notes (Signed)
Patient ID: [name], female   DOB: 1945-12-19, 70 y.o.   MRN: [id number]    DATE:  01/09/2016   MRN:  [id number]  BIRTHDAY: May 01, 1945  Facility:  Nursing Home Location:  [facility]  Nursing Home Room Number: 1201-P  LEVEL OF CARE:  SNF [phone])  Contact Information    Name Relation Home Work [place] Son [phone]  [phone]       Code Status History    Date Active Date Inactive Code Status Order ID Comments User Context   12/31/2015  7:33 PM 01/06/2016  8:09 PM Full Code [id number]  Venita Lick, MD ED       Chief Complaint  Patient presents with  . Hospitalization Follow-up    HISTORY OF PRESENT ILLNESS:  This is a 70 year old fe female who has been admitted to [facility] on 01/06/16 from [facility]. She had a fall  And sustained a closed displaced supracondylar fractur of distal end of left femu with intracondylar extension. She had ORIF of distal femur fracture on 01/03/16.   She was seen in her room today and complained of constipation. She said that her pain is well-controlled.  She has been admitted for a short-term rehabilitation.   PAST MEDICAL HISTORY:  Past Medical History:  Diagnosis Date  . Closed displaced supracondylar fracture of distal end of left femur with intracondylar extension (HCC)   . Closed fracture of left distal femur (HCC)   . Femur fracture, right (HCC)   . Hypokalemia   . Hypomagnesemia   . Osteoporosis      CURRENT MEDICATIONS: Reviewed  Patient's Medications  New Prescriptions   No medications on file  Previous Medications   ENOXAPARIN (LOVENOX) 40 MG/0.4ML INJECTION    Inject 0.4 mLs (40 mg total) into the skin daily.   MULTIPLE VITAMINS-MINERALS (MULTIVITAMIN ADULT PO)    Take 1 tablet by mouth daily.    OXYCODONE (OXY IR/ROXICODONE) 5 MG IMMEDIATE RELEASE TABLET    Take 1-2 tablets (5-10 mg total) by mouth every 4 (four) hours as needed for breakthrough pain ((for MODERATE  breakthrough pain)).  Modified Medications   No medications on file  Discontinued Medications   No medications on file     No Known Allergies   REVIEW OF SYSTEMS:  GENERAL: no change in appetite, no fatigue, no weight changes, no fever, chills or weakness EYES: Denies change in vision, dry eyes, eye pain, itching or discharge EARS: Denies change in hearing, ringing in ears, or earache NOSE: Denies nasal congestion or epistaxis MOUTH and THROAT: Denies oral discomfort, gingival pain or bleeding, pain from teeth or hoarseness   RESPIRATORY: no cough, SOB, DOE, wheezing, hemoptysis CARDIAC: no chest pain, edema or palpitations GI: no abdominal pain, diarrhea, heart burn, nausea or vomiting, + constipation GU: Denies dysuria, frequency, hematuria, incontinence, or discharge PSYCHIATRIC: Denies feeling of depression or anxiety. No report of hallucinations, insomnia, paranoia, or agitation     PHYSICAL EXAMINATION  GENERAL APPEARANCE: Well nourished. In no acute distress. Normal body habitus SKIN:  Left leg is covered with ACE wrap and hinge-knee brace HEAD: Normal in size and contour. No evidence of trauma EYES: Lids open and close normally. No blepharitis, entropion or ectropion. PERRL. Conjunctivae are clear and sclerae are white. Lenses are without opacity EARS: Pinnae are normal. Patient hears normal voice tunes of the examiner MOUTH and THROAT: Lips are without lesions. Oral mucosa is moist and without lesions. Tongue is normal in  shape, size, and color and without lesions NECK: supple, trachea midline, no neck masses, no thyroid tenderness, no thyromegaly LYMPHATICS: no LAN in the neck, no supraclavicular LAN RESPIRATORY: breathing is even & unlabored, BS CTAB CARDIAC: RRR, no murmur,no extra heart sounds, no edema GI: abdomen soft, normal BS, no masses, no tenderness, no hepatomegaly, no splenomegaly EXTREMITIES:  Able to move X 4 extremities PSYCHIATRIC: Alert and oriented  X 3. Affect and behavior are appropriate  LABS/RADIOLOGY: Labs reviewed: Basic Metabolic Panel:  Recent Labs  16/12/9608/16/17 0737  01/04/16 0611 01/04/16 1236 01/05/16 0318 01/06/16 0629  NA  --   < > 136  --  136 136  K  --   < > 4.0  --  3.4* 3.3*  CL  --   < > 104  --  101 101  CO2  --   < > 20*  --  27 26  GLUCOSE  --   < > 110*  --  113* 105*  BUN  --   < > 11  --  10 8  CREATININE  --   < > 0.69 0.71 0.52 0.50  CALCIUM  --   < > 7.8*  --  7.8* 7.9*  MG 1.4*  --  1.6*  --  1.9  --   < > = values in this interval not displayed.  CBC:  Recent Labs  12/31/15 1625  01/04/16 1236 01/05/16 0318 01/06/16 0629  WBC 8.8  < > 9.3 7.1 7.3  NEUTROABS 7.1  --   --   --   --   HGB 13.6  < > 9.8* 8.9* 9.2*  HCT 39.2  < > 28.7* 25.9* 27.2*  MCV 100.0  < > 100.3* 98.9 100.4*  PLT 290  < > 281 268 277  < > = values in this interval not displayed.    Ct Knee Left Wo Contrast  Result Date: 12/31/2015 CLINICAL DATA:  Evaluate left knee fracture.  Initial encounter. EXAM: CT OF THE LEFT KNEE WITHOUT CONTRAST TECHNIQUE: Multidetector CT imaging of the left knee was performed according to the standard protocol. Multiplanar CT image reconstructions were also generated. COMPARISON:  Left knee radiographs performed earlier today at 3:11 p.m. FINDINGS: Bones/Joint/Cartilage There is a comminuted and displaced fracture of the distal femur, with a large posteriorly, proximally and laterally displaced lateral condylar fragment, and an additional mildly displaced fracture line extending across the medial femoral metadiaphysis. The larger medial condylar fragment demonstrates mild posterior displacement and angulation. No additional fractures are seen. There is diffuse chondrocalcinosis at the joint space, and moderate hemarthrosis is noted, with blood tracking throughout Hoffa's fat pad. The cartilage is not well assessed on CT. Ligaments The posterior cruciate ligament appears intact. The anterior  cruciate ligament is not well assessed on CT. The medial collateral ligament and lateral collateral ligament complex are difficult to fully assess. Muscles and Tendons The visualized musculature is grossly unremarkable. The quadriceps and patellar tendons remain intact. Soft tissues The vasculature is not well assessed. No additional soft tissue abnormalities are seen. IMPRESSION: 1. Comminuted and displaced fracture of the distal femur, with a large posteriorly, proximally and laterally displaced lateral condylar fragment, and additional mildly displaced fracture line extending across the medial femoral metadiaphysis. The larger medial condylar fragment demonstrates mild posterior displacement and angulation. 2. Moderate hemarthrosis, with blood tracking throughout Hoffa's fat pad. 3. Diffuse chondrocalcinosis at the joint space. Electronically Signed   By: Roanna RaiderJeffery  Chang M.D.   On: 12/31/2015  18:46   Dg Pelvis Portable  Result Date: 01/01/2016 CLINICAL DATA:  Closed displaced supracondylar fracture of the left femur. EXAM: PORTABLE PELVIS 1-2 VIEWS COMPARISON:  06/29/2005 acute abdominal series FINDINGS: No evidence of fracture or diastasis. Both hips are located and appear intact. Osteopenia and atherosclerosis. L4-5 asymmetric right disc narrowing. IMPRESSION: No acute finding. Electronically Signed   By: Marnee Spring M.D.   On: 01/01/2016 14:54   Chest Portable 1 View  Result Date: 01/01/2016 CLINICAL DATA:  71 y/o  F; left distal femur fracture. EXAM: PORTABLE CHEST 1 VIEW COMPARISON:  09/04/2011 chest radiograph FINDINGS: Stable cardiac silhouette given differences in technique. Aortic atherosclerosis with arch calcification. Minor biapical pleural parenchymal scarring. No consolidation. No pneumothorax or effusion. Degenerative changes of the thoracic spine and mild dextro curvature upper thoracic spine. IMPRESSION: No active disease. Electronically Signed   By: Mitzi Hansen M.D.    On: 01/01/2016 00:51   Dg Knee Complete 4 Views Left  Result Date: 12/31/2015 CLINICAL DATA:  Pt c/o severe knee pain today w/ obv deformity s/p slipped on bottom two steps at her son's house today, landing on knee. EXAM: LEFT KNEE - COMPLETE 4+ VIEW COMPARISON:  None. FINDINGS: Significantly displaced fracture of the distal left femur, with avulsion of a large portion of the lateral femoral condyle, with probable additional impaction at the fracture site. Suspect additional minimally displaced fracture within the medial femoral condyle. No obvious fracture seen within the proximal tibia or fibula. IMPRESSION: 1. Significantly displaced fracture of the distal left femur, with avulsion and rotation of a large portion of the lateral femoral condyle, with probable additional impaction at the fracture site. 2. Suspect additional minimally displaced fracture within the medial femoral condyle. Electronically Signed   By: Bary Richard M.D.   On: 12/31/2015 15:37   Dg C-arm 61-120 Min  Result Date: 01/03/2016 CLINICAL DATA:  Open reduction and internal fixation of left distal femur fracture. EXAM: LEFT FEMUR 2 VIEWS; DG C-ARM 61-120 MIN Fluoroscopy time:  2 minutes 25 seconds. COMPARISON:  Radiographs of December 31, 2015. FINDINGS: Four intraoperative fluoroscopic images retained at the distal left femur. These images demonstrate internal fixation of distal left femoral fracture. Good alignment of fracture components is noted. IMPRESSION: Status post surgical internal fixation of distal left femoral fracture. Electronically Signed   By: Lupita Raider, M.D.   On: 01/03/2016 21:00   Dg Femur Min 2 Views Left  Result Date: 01/03/2016 CLINICAL DATA:  Open reduction and internal fixation of left distal femur fracture. EXAM: LEFT FEMUR 2 VIEWS; DG C-ARM 61-120 MIN Fluoroscopy time:  2 minutes 25 seconds. COMPARISON:  Radiographs of December 31, 2015. FINDINGS: Four intraoperative fluoroscopic images retained at  the distal left femur. These images demonstrate internal fixation of distal left femoral fracture. Good alignment of fracture components is noted. IMPRESSION: Status post surgical internal fixation of distal left femoral fracture. Electronically Signed   By: Lupita Raider, M.D.   On: 01/03/2016 21:00   Dg Femur Port Min 2 Views Left  Result Date: 01/03/2016 CLINICAL DATA:  Postop ORIF. EXAM: LEFT FEMUR PORTABLE 2 VIEWS COMPARISON:  Intra operative images same day FINDINGS: There is plate and screw fixation of a comminuted distal femur fracture. Near anatomic restoration. Articular surfaces appear restored. IMPRESSION: Good appearance following ORIF. Electronically Signed   By: Paulina Fusi M.D.   On: 01/03/2016 21:09    ASSESSMENT/PLAN:  Unsteady gait - for rehabilitation, PT and OT, for therapeutic strengthening  exercises; fall precaution  Closed dyiplaced supracondylar fracture of distal end of left femur with intracondylar extension S/P ORIF of distal femur fracture - for rehabilitation, PT and OT, for therapeutic strengthening exercises; continue Lovenox 40 mg SQ daily for DVT prophylaxis; Oxycodone 5 mg 1-2 tabs PO Q 4 hours PRN for pain;  LLE NWB; follow-up with Dr. Samson Frederic, orthopedic surgeon in 2 weeks  Anemia, acute blood loss - repeat CBC Lab Results  Component Value Date   HGB 9.2 (L) 01/06/2016   Hypokalemia - repeat BMP  Lab Results  Component Value Date   K 3.3 (L) 01/06/2016   Hypomagnesemia - 1.9 ; will monitor  Constipation - start Senna-S 8.6-50 mg 2 tabs PO BID and Miralax 17 gm PO BID     Goals of care:  Short-term rehabilitation     Kenard Gower, NP [facility] [phone]

## 2016-01-10 ENCOUNTER — Encounter: Payer: Self-pay | Admitting: Internal Medicine

## 2016-01-10 ENCOUNTER — Non-Acute Institutional Stay (SKILLED_NURSING_FACILITY): Payer: Medicare Other | Admitting: Internal Medicine

## 2016-01-10 DIAGNOSIS — R2681 Unsteadiness on feet: Secondary | ICD-10-CM | POA: Diagnosis not present

## 2016-01-10 DIAGNOSIS — D62 Acute posthemorrhagic anemia: Secondary | ICD-10-CM | POA: Diagnosis not present

## 2016-01-10 DIAGNOSIS — E440 Moderate protein-calorie malnutrition: Secondary | ICD-10-CM

## 2016-01-10 DIAGNOSIS — E876 Hypokalemia: Secondary | ICD-10-CM | POA: Diagnosis not present

## 2016-01-10 DIAGNOSIS — S72462S Displaced supracondylar fracture with intracondylar extension of lower end of left femur, sequela: Secondary | ICD-10-CM | POA: Diagnosis not present

## 2016-01-10 DIAGNOSIS — K5901 Slow transit constipation: Secondary | ICD-10-CM | POA: Diagnosis not present

## 2016-01-10 LAB — CBC AND DIFFERENTIAL
HCT: 25 % — AB (ref 36–46)
HCT: 25 % — AB (ref 36–46)
HEMOGLOBIN: 8.1 g/dL — AB (ref 12.0–16.0)
Hemoglobin: 8.1 g/dL — AB (ref 12.0–16.0)
Neutrophils Absolute: 5 /uL
PLATELETS: 490 10*3/uL — AB (ref 150–399)
Platelets: 490 10*3/uL — AB (ref 150–399)
WBC: 8.2 10*3/mL
WBC: 8.2 10*3/mL

## 2016-01-10 LAB — BASIC METABOLIC PANEL
BUN: 11 mg/dL (ref 4–21)
BUN: 11 mg/dL (ref 4–21)
CREATININE: 0.5 mg/dL (ref 0.5–1.1)
CREATININE: 0.5 mg/dL (ref 0.5–1.1)
GLUCOSE: 96 mg/dL
Glucose: 96 mg/dL
POTASSIUM: 4.4 mmol/L (ref 3.4–5.3)
Potassium: 4.4 mmol/L (ref 3.4–5.3)
Sodium: 139 mmol/L (ref 137–147)
Sodium: 139 mmol/L (ref 137–147)

## 2016-01-10 NOTE — Progress Notes (Signed)
LOCATION: [facility]  PCP: Carson MyrtleMILLER, LISA NACZKI, MD   Code Status: Full Code  Goals of care: Advanced Directive information Advanced Directives 01/01/2016  Does patient have an advance directive? No  Would patient like information on creating an advanced directive? -       Extended Emergency Contact Information Primary Emergency Contact: Nipper,[name]  [place] Home Phone: [phone] Mobile Phone: [phone] Relation: Son   No Known Allergies  Chief Complaint  Patient presents with  . New Admit To SNF    New Admission Visit     HPI:  Patient is a 70 y.o. female seen today for short term rehabilitation post hospital admission from 12/31/15-01/06/16 with left distal femur fracture post fall. She underwent ORIF on 01/03/16 and is NWB to LLE. She is seen in her room today.   Review of Systems:  Constitutional: Negative for fever, chills, diaphoresis. Feels weak and tired. HENT: Negative for headache, congestion, nasal discharge Eyes: Negative for blurred vision, discharge. Wears glasses. Respiratory: Negative for cough, shortness of breath and wheezing.   Cardiovascular: Negative for chest pain, palpitations, leg swelling.  Gastrointestinal: Negative for heartburn, nausea, vomiting, abdominal pain. Last bowel movement was yesterday. Genitourinary: Negative for dysuria and flank pain.  Musculoskeletal: Negative for back pain, fall in the facility. pain medication has been helpful  Skin: Negative for itching, rash.  Neurological: Negative for dizziness. Psychiatric/Behavioral: Negative for depression   Past Medical History:  Diagnosis Date  . Closed displaced supracondylar fracture of distal end of left femur with intracondylar extension (HCC)   . Closed fracture of left distal femur (HCC)   . Femur fracture, right (HCC)   . Hypokalemia   . Hypomagnesemia   . Osteoporosis    Past Surgical History:  Procedure Laterality Date  . ORIF FEMUR  FRACTURE Left 01/03/2016   Procedure: OPEN REDUCTION INTERNAL FIXATION (ORIF) DISTAL FEMUR FRACTURE;  Surgeon: Samson FredericBrian Swinteck, MD;  Location: [facility] OR;  Service: Orthopedics;  Laterality: Left;   Social History:   reports that she has been smoking.  She does not have any smokeless tobacco history on file. She reports that she does not drink alcohol. Her drug history is not on file.  No family history on file.  Medications:   Medication List       Accurate as of 01/10/16 12:26 PM. Always use your most recent med list.          enoxaparin 40 MG/0.4ML injection Commonly known as:  LOVENOX Inject 0.4 mLs (40 mg total) into the skin daily.   MULTIVITAMIN ADULT PO Take 1 tablet by mouth daily.   oxyCODONE 5 MG immediate release tablet Commonly known as:  Oxy IR/ROXICODONE Take 1-2 tablets (5-10 mg total) by mouth every 4 (four) hours as needed for breakthrough pain ((for MODERATE breakthrough pain)).   polyethylene glycol packet Commonly known as:  MIRALAX / GLYCOLAX Take 17 g by mouth 2 (two) times daily.   sennosides-docusate sodium 8.6-50 MG tablet Commonly known as:  SENOKOT-S Take 2 tablets by mouth 2 (two) times daily.       Immunizations:  There is no immunization history on file for this patient.   Physical Exam:  Vitals:   01/10/16 1223  BP: 115/69  Pulse: 86  Resp: 20  Temp: 98.5 F (36.9 C)  TempSrc: Oral  SpO2: 96%  Weight: 105 lb (47.6 kg)  Height: 5\' 3"  (1.6 m)   Body mass index is 18.6 kg/m.  General- elderly female,  frail and thin built, in no acute distress Head- normocephalic, atraumatic Nose- no maxillary or frontal sinus tenderness, no nasal discharge Throat- moist mucus membrane Eyes- PERRLA, EOMI, no pallor, no icterus Neck- no cervical lymphadenopathy Cardiovascular- irregular heart rate, no murmur, trace leg edema Respiratory- bilateral clear to auscultation, no wheeze, no rhonchi, no crackles, no use of accessory muscles Abdomen-  bowel sounds present, soft, non tender Musculoskeletal- able to move all 4 extremities, limited left leg ROM, left knee hinge brace present, ace wrap in place Neurological- alert and oriented to person, place and time Skin- warm and dry,  Psychiatry- normal mood and affect    Labs reviewed: Basic Metabolic Panel:  Recent Labs  40/98/11 0737  01/04/16 0611 01/04/16 1236 01/05/16 0318 01/06/16 0629  NA  --   < > 136  --  136 136  K  --   < > 4.0  --  3.4* 3.3*  CL  --   < > 104  --  101 101  CO2  --   < > 20*  --  27 26  GLUCOSE  --   < > 110*  --  113* 105*  BUN  --   < > 11  --  10 8  CREATININE  --   < > 0.69 0.71 0.52 0.50  CALCIUM  --   < > 7.8*  --  7.8* 7.9*  MG 1.4*  --  1.6*  --  1.9  --   < > = values in this interval not displayed. Liver Function Tests: No results for input(s): AST, ALT, ALKPHOS, BILITOT, PROT, ALBUMIN in the last 8760 hours. No results for input(s): LIPASE, AMYLASE in the last 8760 hours. No results for input(s): AMMONIA in the last 8760 hours. CBC:  Recent Labs  12/31/15 1625  01/04/16 1236 01/05/16 0318 01/06/16 0629  WBC 8.8  < > 9.3 7.1 7.3  NEUTROABS 7.1  --   --   --   --   HGB 13.6  < > 9.8* 8.9* 9.2*  HCT 39.2  < > 28.7* 25.9* 27.2*  MCV 100.0  < > 100.3* 98.9 100.4*  PLT 290  < > 281 268 277  < > = values in this interval not displayed. Cardiac Enzymes: No results for input(s): CKTOTAL, CKMB, CKMBINDEX, TROPONINI in the last 8760 hours. BNP: Invalid input(s): POCBNP CBG: No results for input(s): GLUCAP in the last 8760 hours.  Radiological Exams: Ct Knee Left Wo Contrast  Result Date: 12/31/2015 CLINICAL DATA:  Evaluate left knee fracture.  Initial encounter. EXAM: CT OF THE LEFT KNEE WITHOUT CONTRAST TECHNIQUE: Multidetector CT imaging of the left knee was performed according to the standard protocol. Multiplanar CT image reconstructions were also generated. COMPARISON:  Left knee radiographs performed earlier today at  3:11 p.m. FINDINGS: Bones/Joint/Cartilage There is a comminuted and displaced fracture of the distal femur, with a large posteriorly, proximally and laterally displaced lateral condylar fragment, and an additional mildly displaced fracture line extending across the medial femoral metadiaphysis. The larger medial condylar fragment demonstrates mild posterior displacement and angulation. No additional fractures are seen. There is diffuse chondrocalcinosis at the joint space, and moderate hemarthrosis is noted, with blood tracking throughout Hoffa's fat pad. The cartilage is not well assessed on CT. Ligaments The posterior cruciate ligament appears intact. The anterior cruciate ligament is not well assessed on CT. The medial collateral ligament and lateral collateral ligament complex are difficult to fully assess. Muscles and Tendons The visualized musculature  is grossly unremarkable. The quadriceps and patellar tendons remain intact. Soft tissues The vasculature is not well assessed. No additional soft tissue abnormalities are seen. IMPRESSION: 1. Comminuted and displaced fracture of the distal femur, with a large posteriorly, proximally and laterally displaced lateral condylar fragment, and additional mildly displaced fracture line extending across the medial femoral metadiaphysis. The larger medial condylar fragment demonstrates mild posterior displacement and angulation. 2. Moderate hemarthrosis, with blood tracking throughout Hoffa's fat pad. 3. Diffuse chondrocalcinosis at the joint space. Electronically Signed   By: Roanna Raider M.D.   On: 12/31/2015 18:46   Dg Pelvis Portable  Result Date: 01/01/2016 CLINICAL DATA:  Closed displaced supracondylar fracture of the left femur. EXAM: PORTABLE PELVIS 1-2 VIEWS COMPARISON:  06/29/2005 acute abdominal series FINDINGS: No evidence of fracture or diastasis. Both hips are located and appear intact. Osteopenia and atherosclerosis. L4-5 asymmetric right disc  narrowing. IMPRESSION: No acute finding. Electronically Signed   By: Marnee Spring M.D.   On: 01/01/2016 14:54   Chest Portable 1 View  Result Date: 01/01/2016 CLINICAL DATA:  70 y/o  F; left distal femur fracture. EXAM: PORTABLE CHEST 1 VIEW COMPARISON:  09/04/2011 chest radiograph FINDINGS: Stable cardiac silhouette given differences in technique. Aortic atherosclerosis with arch calcification. Minor biapical pleural parenchymal scarring. No consolidation. No pneumothorax or effusion. Degenerative changes of the thoracic spine and mild dextro curvature upper thoracic spine. IMPRESSION: No active disease. Electronically Signed   By: Mitzi Hansen M.D.   On: 01/01/2016 00:51   Dg Knee Complete 4 Views Left  Result Date: 12/31/2015 CLINICAL DATA:  Pt c/o severe knee pain today w/ obv deformity s/p slipped on bottom two steps at her son's house today, landing on knee. EXAM: LEFT KNEE - COMPLETE 4+ VIEW COMPARISON:  None. FINDINGS: Significantly displaced fracture of the distal left femur, with avulsion of a large portion of the lateral femoral condyle, with probable additional impaction at the fracture site. Suspect additional minimally displaced fracture within the medial femoral condyle. No obvious fracture seen within the proximal tibia or fibula. IMPRESSION: 1. Significantly displaced fracture of the distal left femur, with avulsion and rotation of a large portion of the lateral femoral condyle, with probable additional impaction at the fracture site. 2. Suspect additional minimally displaced fracture within the medial femoral condyle. Electronically Signed   By: Bary Richard M.D.   On: 12/31/2015 15:37   Dg C-arm 61-120 Min  Result Date: 01/03/2016 CLINICAL DATA:  Open reduction and internal fixation of left distal femur fracture. EXAM: LEFT FEMUR 2 VIEWS; DG C-ARM 61-120 MIN Fluoroscopy time:  2 minutes 25 seconds. COMPARISON:  Radiographs of December 31, 2015. FINDINGS: Four  intraoperative fluoroscopic images retained at the distal left femur. These images demonstrate internal fixation of distal left femoral fracture. Good alignment of fracture components is noted. IMPRESSION: Status post surgical internal fixation of distal left femoral fracture. Electronically Signed   By: Lupita Raider, M.D.   On: 01/03/2016 21:00   Dg Femur Min 2 Views Left  Result Date: 01/03/2016 CLINICAL DATA:  Open reduction and internal fixation of left distal femur fracture. EXAM: LEFT FEMUR 2 VIEWS; DG C-ARM 61-120 MIN Fluoroscopy time:  2 minutes 25 seconds. COMPARISON:  Radiographs of December 31, 2015. FINDINGS: Four intraoperative fluoroscopic images retained at the distal left femur. These images demonstrate internal fixation of distal left femoral fracture. Good alignment of fracture components is noted. IMPRESSION: Status post surgical internal fixation of distal left femoral fracture. Electronically Signed  By: Lupita Raider, M.D.   On: 01/03/2016 21:00   Dg Femur Port Min 2 Views Left  Result Date: 01/03/2016 CLINICAL DATA:  Postop ORIF. EXAM: LEFT FEMUR PORTABLE 2 VIEWS COMPARISON:  Intra operative images same day FINDINGS: There is plate and screw fixation of a comminuted distal femur fracture. Near anatomic restoration. Articular surfaces appear restored. IMPRESSION: Good appearance following ORIF. Electronically Signed   By: Paulina Fusi M.D.   On: 01/03/2016 21:09    Assessment/Plan  Unsteady gait Will have patient work with PT/OT as tolerated to regain strength and restore function.  Fall precautions are in place.  Left distal femur fracture S/p ORIF. Has orthopedic follow up. Continue oxyIR 5 mg 1-2 tab q4h prn pain. Continue lovenox daily for DVT prophylaxis. Will have her work with physical therapy and occupational therapy team to help with gait training and muscle strengthening exercises.fall precautions. Skin care. Encourage to be out of bed.   Blood loss  anemia Post op, monitor cbc  Protein calorie malnutrition RD consult. Monitor po intake. Continue multivitamin  Hypokalemia Check bmp  Slow transit Constipation Continue senokot s and miralax, encouraged hydration    Goals of care: short term rehabilitation   Labs/tests ordered: cbc  Family/ staff Communication: reviewed care plan with patient and nursing supervisor    Oneal Grout, MD Internal Medicine [facility] [address] [place], [place] [address] Cell Phone (Monday-Friday 8 am - 5 pm): [phone] On Call: [phone] and follow prompts after 5 pm and on weekends Office Phone: [phone] Office Fax: [phone]

## 2016-01-11 ENCOUNTER — Non-Acute Institutional Stay (SKILLED_NURSING_FACILITY): Payer: Medicare Other | Admitting: Adult Health

## 2016-01-11 ENCOUNTER — Encounter: Payer: Self-pay | Admitting: Adult Health

## 2016-01-11 DIAGNOSIS — D62 Acute posthemorrhagic anemia: Secondary | ICD-10-CM

## 2016-01-11 DIAGNOSIS — K5909 Other constipation: Secondary | ICD-10-CM

## 2016-01-11 NOTE — Progress Notes (Signed)
Patient ID: [name], female   DOB: 11/15/1945, 70 y.o.   MRN: [id number]    DATE:  01/11/2016   MRN:  [id number]  BIRTHDAY: [birth year]  Facility:  Nursing Home Location:  [facility]  Nursing Home Room Number: 1201-P  LEVEL OF CARE:  SNF [phone])  Contact Information    Name Relation Home Work [place] Son [phone]  [phone]   Garron,[name] Other   [phone]       Code Status History    Date Active Date Inactive Code Status Order ID Comments User Context   12/31/2015  7:33 PM 01/06/2016  8:09 PM Full Code [id number]  Venita Lick, MD ED       Chief Complaint  Patient presents with  . Acute Visit    Constipation, anemia, hypomagnesemia    HISTORY OF PRESENT ILLNESS:  This is a 70 year old female who is currently having a short-term rehabilitation @ [address] She was noted to have hgb 8.1, which dropped from hgb 9.2 . Latest magnesium 1.6. She did not complain of dizziness. She complained of beings constipated despite being on Senna-S and Miralax.  She has been admitted to [facility] on 01/06/16 from [facility]. She had a fall  And sustained a closed displaced supracondylar fractur of distal end of left femu with intracondylar extension. She had ORIF of distal femur fracture on 01/03/16.    PAST MEDICAL HISTORY:  Past Medical History:  Diagnosis Date  . Closed displaced supracondylar fracture of distal end of left femur with intracondylar extension (HCC)   . Closed fracture of left distal femur (HCC)   . Femur fracture, right (HCC)   . Hypokalemia   . Hypomagnesemia   . Osteoporosis      CURRENT MEDICATIONS: Reviewed  Patient's Medications  New Prescriptions   No medications on file  Previous Medications   ENOXAPARIN (LOVENOX) 40 MG/0.4ML INJECTION    Inject 0.4 mLs (40 mg total) into the skin daily.   FERROUS SULFATE 325 (65 FE) MG TABLET    Take 325 mg by mouth daily with breakfast.   LUBIPROSTONE  (AMITIZA) 24 MCG CAPSULE    Take 24 mcg by mouth 2 (two) times daily with a meal.   MAGNESIUM OXIDE (MAG-OX) 400 MG TABLET    Take 400 mg by mouth daily.   MULTIPLE VITAMINS-MINERALS (MULTIVITAMIN ADULT PO)    Take 1 tablet by mouth daily.    OXYCODONE (OXY IR/ROXICODONE) 5 MG IMMEDIATE RELEASE TABLET    Take 1-2 tablets (5-10 mg total) by mouth every 4 (four) hours as needed for breakthrough pain ((for MODERATE breakthrough pain)).   POLYETHYLENE GLYCOL (MIRALAX / GLYCOLAX) PACKET    Take 17 g by mouth 2 (two) times daily.   SENNOSIDES-DOCUSATE SODIUM (SENOKOT-S) 8.6-50 MG TABLET    Take 2 tablets by mouth 2 (two) times daily.  Modified Medications   No medications on file  Discontinued Medications   No medications on file     No Known Allergies   REVIEW OF SYSTEMS:  GENERAL: no change in appetite, no fatigue, no weight changes, no fever, chills or weakness EYES: Denies change in vision, dry eyes, eye pain, itching or discharge EARS: Denies change in hearing, ringing in ears, or earache NOSE: Denies nasal congestion or epistaxis MOUTH and THROAT: Denies oral discomfort, gingival pain or bleeding, pain from teeth or hoarseness   RESPIRATORY: no cough, SOB, DOE, wheezing, hemoptysis CARDIAC: no chest pain, edema or palpitations GI:  no abdominal pain, diarrhea, heart burn, nausea or vomiting, + constipation GU: Denies dysuria, frequency, hematuria, incontinence, or discharge PSYCHIATRIC: Denies feeling of depression or anxiety. No report of hallucinations, insomnia, paranoia, or agitation     PHYSICAL EXAMINATION  GENERAL APPEARANCE: Well nourished. In no acute distress. Normal body habitus SKIN:  Left leg is covered with ACE wrap and hinge-knee brace HEAD: Normal in size and contour. No evidence of trauma EYES: Lids open and close normally. No blepharitis, entropion or ectropion. PERRL. Conjunctivae are clear and sclerae are white. Lenses are without opacity EARS: Pinnae are  normal. Patient hears normal voice tunes of the examiner MOUTH and THROAT: Lips are without lesions. Oral mucosa is moist and without lesions. Tongue is normal in shape, size, and color and without lesions NECK: supple, trachea midline, no neck masses, no thyroid tenderness, no thyromegaly LYMPHATICS: no LAN in the neck, no supraclavicular LAN RESPIRATORY: breathing is even & unlabored, BS CTAB CARDIAC: RRR, no murmur,no extra heart sounds, no edema GI: abdomen soft, normal BS, no masses, no tenderness, no hepatomegaly, no splenomegaly EXTREMITIES:  Able to move X 4 extremities PSYCHIATRIC: Alert and oriented X 3. Affect and behavior are appropriate  LABS/RADIOLOGY: Labs reviewed: Basic Metabolic Panel:  Recent Labs  16/10/96 0737  01/04/16 0611  01/05/16 0318 01/06/16 0629 01/10/16  NA  --   < > 136  --  136 136 139  K  --   < > 4.0  --  3.4* 3.3* 4.4  CL  --   < > 104  --  101 101  --   CO2  --   < > 20*  --  27 26  --   GLUCOSE  --   < > 110*  --  113* 105*  --   BUN  --   < > 11  --  10 8 11   CREATININE  --   < > 0.69  < > 0.52 0.50 0.5  CALCIUM  --   < > 7.8*  --  7.8* 7.9*  --   MG 1.4*  --  1.6*  --  1.9  --   --   < > = values in this interval not displayed.  CBC:  Recent Labs  12/31/15 1625  01/04/16 1236 01/05/16 0318 01/06/16 0629 01/10/16  WBC 8.8  < > 9.3 7.1 7.3 8.2  NEUTROABS 7.1  --   --   --   --  5  HGB 13.6  < > 9.8* 8.9* 9.2* 8.1*  HCT 39.2  < > 28.7* 25.9* 27.2* 25*  MCV 100.0  < > 100.3* 98.9 100.4*  --   PLT 290  < > 281 268 277 490*  < > = values in this interval not displayed.    Ct Knee Left Wo Contrast  Result Date: 12/31/2015 CLINICAL DATA:  Evaluate left knee fracture.  Initial encounter. EXAM: CT OF THE LEFT KNEE WITHOUT CONTRAST TECHNIQUE: Multidetector CT imaging of the left knee was performed according to the standard protocol. Multiplanar CT image reconstructions were also generated. COMPARISON:  Left knee radiographs performed  earlier today at 3:11 p.m. FINDINGS: Bones/Joint/Cartilage There is a comminuted and displaced fracture of the distal femur, with a large posteriorly, proximally and laterally displaced lateral condylar fragment, and an additional mildly displaced fracture line extending across the medial femoral metadiaphysis. The larger medial condylar fragment demonstrates mild posterior displacement and angulation. No additional fractures are seen. There is diffuse chondrocalcinosis at the joint  space, and moderate hemarthrosis is noted, with blood tracking throughout Hoffa's fat pad. The cartilage is not well assessed on CT. Ligaments The posterior cruciate ligament appears intact. The anterior cruciate ligament is not well assessed on CT. The medial collateral ligament and lateral collateral ligament complex are difficult to fully assess. Muscles and Tendons The visualized musculature is grossly unremarkable. The quadriceps and patellar tendons remain intact. Soft tissues The vasculature is not well assessed. No additional soft tissue abnormalities are seen. IMPRESSION: 1. Comminuted and displaced fracture of the distal femur, with a large posteriorly, proximally and laterally displaced lateral condylar fragment, and additional mildly displaced fracture line extending across the medial femoral metadiaphysis. The larger medial condylar fragment demonstrates mild posterior displacement and angulation. 2. Moderate hemarthrosis, with blood tracking throughout Hoffa's fat pad. 3. Diffuse chondrocalcinosis at the joint space. Electronically Signed   By: Roanna RaiderJeffery  Chang M.D.   On: 12/31/2015 18:46   Dg Pelvis Portable  Result Date: 01/01/2016 CLINICAL DATA:  Closed displaced supracondylar fracture of the left femur. EXAM: PORTABLE PELVIS 1-2 VIEWS COMPARISON:  06/29/2005 acute abdominal series FINDINGS: No evidence of fracture or diastasis. Both hips are located and appear intact. Osteopenia and atherosclerosis. L4-5 asymmetric  right disc narrowing. IMPRESSION: No acute finding. Electronically Signed   By: Marnee SpringJonathon  Watts M.D.   On: 01/01/2016 14:54   Chest Portable 1 View  Result Date: 01/01/2016 CLINICAL DATA:  70 y/o  F; left distal femur fracture. EXAM: PORTABLE CHEST 1 VIEW COMPARISON:  09/04/2011 chest radiograph FINDINGS: Stable cardiac silhouette given differences in technique. Aortic atherosclerosis with arch calcification. Minor biapical pleural parenchymal scarring. No consolidation. No pneumothorax or effusion. Degenerative changes of the thoracic spine and mild dextro curvature upper thoracic spine. IMPRESSION: No active disease. Electronically Signed   By: Mitzi HansenLance  Furusawa-Stratton M.D.   On: 01/01/2016 00:51   Dg Knee Complete 4 Views Left  Result Date: 12/31/2015 CLINICAL DATA:  Pt c/o severe knee pain today w/ obv deformity s/p slipped on bottom two steps at her son's house today, landing on knee. EXAM: LEFT KNEE - COMPLETE 4+ VIEW COMPARISON:  None. FINDINGS: Significantly displaced fracture of the distal left femur, with avulsion of a large portion of the lateral femoral condyle, with probable additional impaction at the fracture site. Suspect additional minimally displaced fracture within the medial femoral condyle. No obvious fracture seen within the proximal tibia or fibula. IMPRESSION: 1. Significantly displaced fracture of the distal left femur, with avulsion and rotation of a large portion of the lateral femoral condyle, with probable additional impaction at the fracture site. 2. Suspect additional minimally displaced fracture within the medial femoral condyle. Electronically Signed   By: Bary RichardStan  Maynard M.D.   On: 12/31/2015 15:37   Dg C-arm 61-120 Min  Result Date: 01/03/2016 CLINICAL DATA:  Open reduction and internal fixation of left distal femur fracture. EXAM: LEFT FEMUR 2 VIEWS; DG C-ARM 61-120 MIN Fluoroscopy time:  2 minutes 25 seconds. COMPARISON:  Radiographs of December 31, 2015. FINDINGS:  Four intraoperative fluoroscopic images retained at the distal left femur. These images demonstrate internal fixation of distal left femoral fracture. Good alignment of fracture components is noted. IMPRESSION: Status post surgical internal fixation of distal left femoral fracture. Electronically Signed   By: Lupita RaiderJames  Green Jr, M.D.   On: 01/03/2016 21:00   Dg Femur Min 2 Views Left  Result Date: 01/03/2016 CLINICAL DATA:  Open reduction and internal fixation of left distal femur fracture. EXAM: LEFT FEMUR 2 VIEWS; DG  C-ARM 61-120 MIN Fluoroscopy time:  2 minutes 25 seconds. COMPARISON:  Radiographs of December 31, 2015. FINDINGS: Four intraoperative fluoroscopic images retained at the distal left femur. These images demonstrate internal fixation of distal left femoral fracture. Good alignment of fracture components is noted. IMPRESSION: Status post surgical internal fixation of distal left femoral fracture. Electronically Signed   By: Lupita Raider, M.D.   On: 01/03/2016 21:00   Dg Femur Port Min 2 Views Left  Result Date: 01/03/2016 CLINICAL DATA:  Postop ORIF. EXAM: LEFT FEMUR PORTABLE 2 VIEWS COMPARISON:  Intra operative images same day FINDINGS: There is plate and screw fixation of a comminuted distal femur fracture. Near anatomic restoration. Articular surfaces appear restored. IMPRESSION: Good appearance following ORIF. Electronically Signed   By: Paulina Fusi M.D.   On: 01/03/2016 21:09    ASSESSMENT/PLAN:  Anemia, acute blood loss - hgb 8.1 ; start FeSO4 325 mg 1 tab PO Q D and CBC in 1 week  Hypomagnesemia - Mg 1.6 ; start Magnesium oxide 400 mg 1 tab PO Q D; Magnesium level in 1 week  Chronic constipation - start Amitiza 24 mcg 1 capsule PO BID; continue Senna-S 8.6-50 mg 2 tabs PO BID and Miralax 17 gm PO BID      Kenard Gower, NP [employer] [phone]

## 2016-01-13 ENCOUNTER — Non-Acute Institutional Stay (SKILLED_NURSING_FACILITY): Payer: Medicare Other | Admitting: Internal Medicine

## 2016-01-13 ENCOUNTER — Other Ambulatory Visit: Payer: Self-pay | Admitting: *Deleted

## 2016-01-13 ENCOUNTER — Encounter: Payer: Self-pay | Admitting: Internal Medicine

## 2016-01-13 DIAGNOSIS — K5901 Slow transit constipation: Secondary | ICD-10-CM | POA: Diagnosis not present

## 2016-01-13 DIAGNOSIS — R21 Rash and other nonspecific skin eruption: Secondary | ICD-10-CM | POA: Diagnosis not present

## 2016-01-13 DIAGNOSIS — D62 Acute posthemorrhagic anemia: Secondary | ICD-10-CM | POA: Diagnosis not present

## 2016-01-13 DIAGNOSIS — I959 Hypotension, unspecified: Secondary | ICD-10-CM

## 2016-01-13 DIAGNOSIS — S7291XS Unspecified fracture of right femur, sequela: Secondary | ICD-10-CM

## 2016-01-13 MED ORDER — OXYCODONE HCL 5 MG PO TABS: ORAL_TABLET | ORAL | 0 refills | Status: DC

## 2016-01-13 NOTE — Progress Notes (Signed)
LOCATION: [facility]  PCP: Carson Myrtle, MD   Code Status: Full Code  Goals of care: Advanced Directive information Advanced Directives 01/01/2016  Does patient have an advance directive? No  Would patient like information on creating an advanced directive? -       Extended Emergency Contact Information Primary Emergency Contact: Raggio,[name] Address: [address]          [place], [place] [address] D[place] of [place] Home Phone: [phone] Mobile Phone: [phone] Relation: Son Secondary Emergency Contact: Spielberg,[name] Address: [address]          [place], [place] [address] [place] of [place] Mobile Phone: [phone] Relation: Other   No Known Allergies  Chief Complaint  Patient presents with  . Acute Visit    Rash     HPI:  Patient is a 70 y.o. female seen today for acute visit for rash. She has developed rash last night on her arms and legs. She was started on feso4 last evening. She was also to receive amitiza last night but has not received it yet. She was started on feso4 for low Hb. Of note she is here for short term rehabilitation post hospital admission from 12/31/15-01/06/16 with left distal femur fracture post fall. She underwent ORIF on 01/03/16 and is NWB to LLE. She is seen in her room today. She complaints of itching to her arms and legs and benadryl has helped her some. On review of her bedside vital signs, has low BP recorded manually.   Review of Systems:  Constitutional: Negative for fever, chills HENT: Negative for headache, congestion, tongue swelling, lip swelling, dysphagia.  Eyes: Negative for blurred vision, discharge. Wears glasses. Respiratory: Negative for cough, shortness of breath and wheezing.   Cardiovascular: Negative for chest pain, palpitations.  Gastrointestinal: Negative for heartburn, nausea, vomiting, abdominal pain. Had bowel movement this am. No blood in stool Genitourinary: Negative for  dysuria Musculoskeletal: Negative for back pain, fall in the facility. pain medication has been helpful  Skin: Negative for itching, rash.  Neurological: Negative for dizziness. Psychiatric/Behavioral: Negative for depression   Past Medical History:  Diagnosis Date  . Closed displaced supracondylar fracture of distal end of left femur with intracondylar extension (HCC)   . Closed fracture of left distal femur (HCC)   . Femur fracture, right (HCC)   . Hypokalemia   . Hypomagnesemia   . Osteoporosis    Past Surgical History:  Procedure Laterality Date  . ORIF FEMUR FRACTURE Left 01/03/2016   Procedure: OPEN REDUCTION INTERNAL FIXATION (ORIF) DISTAL FEMUR FRACTURE;  Surgeon: Samson Frederic, MD;  Location: [facility] OR;  Service: Orthopedics;  Laterality: Left;    Medications:   Medication List       Accurate as of 01/13/16 10:45 AM. Always use your most recent med list.          diphenhydrAMINE 25 MG tablet Commonly known as:  BENADRYL Take 25 mg by mouth every 6 (six) hours as needed for itching. Stop date 01/14/16   enoxaparin 40 MG/0.4ML injection Commonly known as:  LOVENOX Inject 0.4 mLs (40 mg total) into the skin daily.   ferrous sulfate 325 (65 FE) MG tablet Take 325 mg by mouth daily with breakfast.   lubiprostone 24 MCG capsule Commonly known as:  AMITIZA Take 24 mcg by mouth 2 (two) times daily with a meal.   magnesium oxide 400 MG tablet Commonly known as:  MAG-OX Take 400 mg by mouth daily.   MULTIVITAMIN ADULT  PO Take 1 tablet by mouth daily.   oxyCODONE 5 MG immediate release tablet Commonly known as:  Oxy IR/ROXICODONE Take 1-2 tablets (5-10 mg total) by mouth every 4 (four) hours as needed for breakthrough pain ((for MODERATE breakthrough pain)).   polyethylene glycol packet Commonly known as:  MIRALAX / GLYCOLAX Take 17 g by mouth 2 (two) times daily.   ranitidine 300 MG tablet Commonly known as:  ZANTAC Take 300 mg by mouth at bedtime. Stop  date 01/14/16   sennosides-docusate sodium 8.6-50 MG tablet Commonly known as:  SENOKOT-S Take 2 tablets by mouth 2 (two) times daily.       Immunizations:  There is no immunization history on file for this patient.   Physical Exam:  Vitals:   01/13/16 1044  BP: (!) 90/54  Pulse: 74  Temp: 97.8 F (36.6 C)  TempSrc: Oral  SpO2: 97%  Weight: 105 lb (47.6 kg)  Height: 5\' 3"  (1.6 m)   Body mass index is 18.6 kg/m.  General- elderly female, frail and thin built, in no acute distress Head- normocephalic, atraumatic Throat- moist mucus membrane, no lip and tongue swelling, normal oropharynx Eyes- PERRLA, EOMI, no pallor, no icterus Neck- no cervical lymphadenopathy Cardiovascular- irregular heart rate, no murmur, trace leg edema Respiratory- bilateral clear to auscultation, no wheeze, no rhonchi, no crackles, no use of accessory muscles Abdomen- bowel sounds present, soft, non tender Musculoskeletal- able to move all 4 extremities, limited left leg ROM, left knee hinge brace present, ace wrap in place Neurological- alert and oriented to person, place and time Skin- warm and dry, erythematous generalized rash to her arms and legs, no petechiae and purpura Psychiatry- normal mood and affect    Labs reviewed: Basic Metabolic Panel:  Recent Labs  40/98/11 0737  01/04/16 0611  01/05/16 0318 01/06/16 0629 01/10/16  NA  --   < > 136  --  136 136 139  139  K  --   < > 4.0  --  3.4* 3.3* 4.4  4.4  CL  --   < > 104  --  101 101  --   CO2  --   < > 20*  --  27 26  --   GLUCOSE  --   < > 110*  --  113* 105*  --   BUN  --   < > 11  --  10 8 11  11   CREATININE  --   < > 0.69  < > 0.52 0.50 0.5  0.5  CALCIUM  --   < > 7.8*  --  7.8* 7.9*  --   MG 1.4*  --  1.6*  --  1.9  --   --   < > = values in this interval not displayed.  CBC:  Recent Labs  12/31/15 1625  01/04/16 1236 01/05/16 0318 01/06/16 0629 01/10/16  WBC 8.8  < > 9.3 7.1 7.3 8.2  8.2  NEUTROABS 7.1   --   --   --   --  5  HGB 13.6  < > 9.8* 8.9* 9.2* 8.1*  8.1*  HCT 39.2  < > 28.7* 25.9* 27.2* 25*  25*  MCV 100.0  < > 100.3* 98.9 100.4*  --   PLT 290  < > 281 268 277 490*  490*  < > = values in this interval not displayed.   Radiological Exams: Ct Knee Left Wo Contrast  Result Date: 12/31/2015 CLINICAL DATA:  Evaluate left knee  fracture.  Initial encounter. EXAM: CT OF THE LEFT KNEE WITHOUT CONTRAST TECHNIQUE: Multidetector CT imaging of the left knee was performed according to the standard protocol. Multiplanar CT image reconstructions were also generated. COMPARISON:  Left knee radiographs performed earlier today at 3:11 p.m. FINDINGS: Bones/Joint/Cartilage There is a comminuted and displaced fracture of the distal femur, with a large posteriorly, proximally and laterally displaced lateral condylar fragment, and an additional mildly displaced fracture line extending across the medial femoral metadiaphysis. The larger medial condylar fragment demonstrates mild posterior displacement and angulation. No additional fractures are seen. There is diffuse chondrocalcinosis at the joint space, and moderate hemarthrosis is noted, with blood tracking throughout Hoffa's fat pad. The cartilage is not well assessed on CT. Ligaments The posterior cruciate ligament appears intact. The anterior cruciate ligament is not well assessed on CT. The medial collateral ligament and lateral collateral ligament complex are difficult to fully assess. Muscles and Tendons The visualized musculature is grossly unremarkable. The quadriceps and patellar tendons remain intact. Soft tissues The vasculature is not well assessed. No additional soft tissue abnormalities are seen. IMPRESSION: 1. Comminuted and displaced fracture of the distal femur, with a large posteriorly, proximally and laterally displaced lateral condylar fragment, and additional mildly displaced fracture line extending across the medial femoral metadiaphysis.  The larger medial condylar fragment demonstrates mild posterior displacement and angulation. 2. Moderate hemarthrosis, with blood tracking throughout Hoffa's fat pad. 3. Diffuse chondrocalcinosis at the joint space. Electronically Signed   By: Roanna Raider M.D.   On: 12/31/2015 18:46   Dg Pelvis Portable  Result Date: 01/01/2016 CLINICAL DATA:  Closed displaced supracondylar fracture of the left femur. EXAM: PORTABLE PELVIS 1-2 VIEWS COMPARISON:  06/29/2005 acute abdominal series FINDINGS: No evidence of fracture or diastasis. Both hips are located and appear intact. Osteopenia and atherosclerosis. L4-5 asymmetric right disc narrowing. IMPRESSION: No acute finding. Electronically Signed   By: Marnee Spring M.D.   On: 01/01/2016 14:54   Chest Portable 1 View  Result Date: 01/01/2016 CLINICAL DATA:  69 y/o  F; left distal femur fracture. EXAM: PORTABLE CHEST 1 VIEW COMPARISON:  09/04/2011 chest radiograph FINDINGS: Stable cardiac silhouette given differences in technique. Aortic atherosclerosis with arch calcification. Minor biapical pleural parenchymal scarring. No consolidation. No pneumothorax or effusion. Degenerative changes of the thoracic spine and mild dextro curvature upper thoracic spine. IMPRESSION: No active disease. Electronically Signed   By: Mitzi Hansen M.D.   On: 01/01/2016 00:51   Dg Knee Complete 4 Views Left  Result Date: 12/31/2015 CLINICAL DATA:  Pt c/o severe knee pain today w/ obv deformity s/p slipped on bottom two steps at her son's house today, landing on knee. EXAM: LEFT KNEE - COMPLETE 4+ VIEW COMPARISON:  None. FINDINGS: Significantly displaced fracture of the distal left femur, with avulsion of a large portion of the lateral femoral condyle, with probable additional impaction at the fracture site. Suspect additional minimally displaced fracture within the medial femoral condyle. No obvious fracture seen within the proximal tibia or fibula. IMPRESSION: 1.  Significantly displaced fracture of the distal left femur, with avulsion and rotation of a large portion of the lateral femoral condyle, with probable additional impaction at the fracture site. 2. Suspect additional minimally displaced fracture within the medial femoral condyle. Electronically Signed   By: Bary Richard M.D.   On: 12/31/2015 15:37   Dg C-arm 61-120 Min  Result Date: 01/03/2016 CLINICAL DATA:  Open reduction and internal fixation of left distal femur fracture. EXAM: LEFT FEMUR  2 VIEWS; DG C-ARM 61-120 MIN Fluoroscopy time:  2 minutes 25 seconds. COMPARISON:  Radiographs of December 31, 2015. FINDINGS: Four intraoperative fluoroscopic images retained at the distal left femur. These images demonstrate internal fixation of distal left femoral fracture. Good alignment of fracture components is noted. IMPRESSION: Status post surgical internal fixation of distal left femoral fracture. Electronically Signed   By: Lupita RaiderJames  Green Jr, M.D.   On: 01/03/2016 21:00   Dg Femur Min 2 Views Left  Result Date: 01/03/2016 CLINICAL DATA:  Open reduction and internal fixation of left distal femur fracture. EXAM: LEFT FEMUR 2 VIEWS; DG C-ARM 61-120 MIN Fluoroscopy time:  2 minutes 25 seconds. COMPARISON:  Radiographs of December 31, 2015. FINDINGS: Four intraoperative fluoroscopic images retained at the distal left femur. These images demonstrate internal fixation of distal left femoral fracture. Good alignment of fracture components is noted. IMPRESSION: Status post surgical internal fixation of distal left femoral fracture. Electronically Signed   By: Lupita RaiderJames  Green Jr, M.D.   On: 01/03/2016 21:00   Dg Femur Port Min 2 Views Left  Result Date: 01/03/2016 CLINICAL DATA:  Postop ORIF. EXAM: LEFT FEMUR PORTABLE 2 VIEWS COMPARISON:  Intra operative images same day FINDINGS: There is plate and screw fixation of a comminuted distal femur fracture. Near anatomic restoration. Articular surfaces appear restored.  IMPRESSION: Good appearance following ORIF. Electronically Signed   By: Paulina FusiMark  Shogry M.D.   On: 01/03/2016 21:09    Assessment/Plan  Rash Likely drug reaction to ferrous sulfate as that was the new med started. D/c feso4. Start atarax 10 mg q8h prn itching and monitor. No airway compromise at present  Blood loss anemia D/c feso4. Check cbc. FOBT X 3 with her on lovenox. Clinically asymptomatic, monitor for now with vital signs.   Hypotension Denies headache and dizziness. No chest pain reported. Check orthostatic vitals daily for now. Change her oxyIR to 1-2 tab q6h prn pain from q4h prn with soft bp reading.   Left distal femur fracture S/p ORIF. Has orthopedic follow up. Changes to oxyIR made as above. get PMR to assess for pain management  Constipation Current regimen of senna s and miralax has been helpful. Discontinue amitiza for now   Labs/tests ordered: cbc, bmp, mg 01/16/16  Family/ staff Communication: reviewed care plan with patient and nursing supervisor    Oneal GroutMAHIMA Cordai Rodrigue, MD Internal Medicine [facility] [address] [place], [place] [address] Cell Phone (Monday-Friday 8 am - 5 pm): [phone] On Call: [phone] and follow prompts after 5 pm and on weekends Office Phone: [phone] Office Fax: [phone]

## 2016-01-13 NOTE — Telephone Encounter (Signed)
[facility] #[phone] Fax: [phone] 

## 2016-01-16 LAB — BASIC METABOLIC PANEL
BUN: 10 mg/dL (ref 4–21)
Creatinine: 0.5 mg/dL (ref 0.5–1.1)
Glucose: 87 mg/dL
POTASSIUM: 4.3 mmol/L (ref 3.4–5.3)
SODIUM: 137 mmol/L (ref 137–147)

## 2016-01-16 LAB — CBC AND DIFFERENTIAL
HCT: 26 % — AB (ref 36–46)
Hemoglobin: 8.4 g/dL — AB (ref 12.0–16.0)
Platelets: 479 10*3/uL — AB (ref 150–399)
WBC: 5.9 10*3/mL

## 2016-01-18 LAB — CBC AND DIFFERENTIAL
HEMATOCRIT: 27 % — AB (ref 36–46)
HEMOGLOBIN: 8.6 g/dL — AB (ref 12.0–16.0)
Platelets: 524 10*3/uL — AB (ref 150–399)
WBC: 6.5 10^3/mL

## 2016-01-31 ENCOUNTER — Non-Acute Institutional Stay (SKILLED_NURSING_FACILITY): Payer: Medicare Other | Admitting: Adult Health

## 2016-01-31 ENCOUNTER — Encounter: Payer: Self-pay | Admitting: Adult Health

## 2016-01-31 DIAGNOSIS — S7291XS Unspecified fracture of right femur, sequela: Secondary | ICD-10-CM | POA: Diagnosis not present

## 2016-01-31 DIAGNOSIS — K5901 Slow transit constipation: Secondary | ICD-10-CM | POA: Diagnosis not present

## 2016-01-31 DIAGNOSIS — R2681 Unsteadiness on feet: Secondary | ICD-10-CM

## 2016-01-31 DIAGNOSIS — E876 Hypokalemia: Secondary | ICD-10-CM | POA: Diagnosis not present

## 2016-01-31 DIAGNOSIS — D62 Acute posthemorrhagic anemia: Secondary | ICD-10-CM | POA: Diagnosis not present

## 2016-01-31 NOTE — Progress Notes (Signed)
Patient ID: [name], female   DOB: 1946/02/09, 70 y.o.   MRN: [id number]    DATE:  01/31/2016   MRN:  [id number]  BIRTHDAY: February 09, 1946  Facility:  Nursing Home Location:  [facility]  Nursing Home Room Number: 1201-P  LEVEL OF CARE:  SNF [phone])  Contact Information    Name Relation Home Work [place] Son [phone]  [phone]   Shells,[name] Other   [phone]       Code Status History    Date Active Date Inactive Code Status Order ID Comments User Context   12/31/2015  7:33 PM 01/06/2016  8:09 PM Full Code [id number]  Venita Lick, MD ED       Chief Complaint  Patient presents with  . Discharge Note    HISTORY OF PRESENT ILLNESS:  This is a 70 year old female who is for discharge home with Home health PT, OT, Dealer. DME:  Wheelchair (standard) with elevating leg rests, break extensions, anti-tippers, percussion and bedside commode.  She has been admitted to [facility] on 01/06/16 from [facility]. She had a fall  And sustained a closed displaced supracondylar fractur of distal end of left femu with intracondylar extension. She had ORIF of distal femur fracture on 01/03/16.   Patient was admitted to this facility for short-term rehabilitation after the patient's recent hospitalization.  Patient has completed SNF rehabilitation and therapy has cleared the patient for discharge.   PAST MEDICAL HISTORY:  Past Medical History:  Diagnosis Date  . Closed displaced supracondylar fracture of distal end of left femur with intracondylar extension (HCC)   . Closed fracture of left distal femur (HCC)   . Femur fracture, right (HCC)   . Hypokalemia   . Hypomagnesemia   . Osteoporosis      CURRENT MEDICATIONS: Reviewed  Patient's Medications  New Prescriptions   No medications on file  Previous Medications   ACETAMINOPHEN (TYLENOL) 500 MG TABLET    Take 500 mg by mouth every 8 (eight) hours as needed for  mild pain or moderate pain.   ENOXAPARIN (LOVENOX) 40 MG/0.4ML INJECTION    Inject 0.4 mLs (40 mg total) into the skin daily.   MAGNESIUM OXIDE (MAG-OX) 400 MG TABLET    Take 400 mg by mouth daily.   MENTHOL (ICY HOT) 5 % PTCH    Apply topically daily. Apply 1 patch to anterior chest wall and to right shoulder QAM and remove QHS.  Monitor for skin irritation.   METHOCARBAMOL (ROBAXIN) 500 MG TABLET    Take 500 mg by mouth every 6 (six) hours as needed for muscle spasms.   MULTIPLE VITAMINS-MINERALS (MULTIVITAMIN ADULT PO)    Take 1 tablet by mouth daily.    OXYCODONE (OXY IR/ROXICODONE) 5 MG IMMEDIATE RELEASE TABLET    Take one tablet by mouth every 6 hours as needed for moderate pain; Take two tablets by mouth every 6 hours as needed for severe pain.   POLYETHYLENE GLYCOL (MIRALAX / GLYCOLAX) PACKET    Take 17 g by mouth 2 (two) times daily.   SENNOSIDES-DOCUSATE SODIUM (SENOKOT-S) 8.6-50 MG TABLET    Take 2 tablets by mouth 2 (two) times daily.  Modified Medications   No medications on file  Discontinued Medications   DIPHENHYDRAMINE (BENADRYL) 25 MG TABLET    Take 25 mg by mouth every 6 (six) hours as needed for itching. Stop date 01/14/16   FERROUS SULFATE 325 (65 FE) MG TABLET  Take 325 mg by mouth daily with breakfast.   LUBIPROSTONE (AMITIZA) 24 MCG CAPSULE    Take 24 mcg by mouth 2 (two) times daily with a meal.   RANITIDINE (ZANTAC) 300 MG TABLET    Take 300 mg by mouth at bedtime. Stop date 01/14/16     No Known Allergies   REVIEW OF SYSTEMS:  GENERAL: no change in appetite, no fatigue, no weight changes, no fever, chills or weakness EYES: Denies change in vision, dry eyes, eye pain, itching or discharge EARS: Denies change in hearing, ringing in ears, or earache NOSE: Denies nasal congestion or epistaxis MOUTH and THROAT: Denies oral discomfort, gingival pain or bleeding, pain from teeth or hoarseness   RESPIRATORY: no cough, SOB, DOE, wheezing, hemoptysis CARDIAC: no chest  pain, edema or palpitations GI: no abdominal pain, diarrhea, heart burn, nausea or vomiting, + constipation GU: Denies dysuria, frequency, hematuria, incontinence, or discharge PSYCHIATRIC: Denies feeling of depression or anxiety. No report of hallucinations, insomnia, paranoia, or agitation     PHYSICAL EXAMINATION  GENERAL APPEARANCE: Well nourished. In no acute distress. Normal body habitus SKIN:  Left knee surgical incision is healed and has hinge-knee brace on LLE HEAD: Normal in size and contour. No evidence of trauma EYES: Lids open and close normally. No blepharitis, entropion or ectropion. PERRL. Conjunctivae are clear and sclerae are white. Lenses are without opacity EARS: Pinnae are normal. Patient hears normal voice tunes of the examiner MOUTH and THROAT: Lips are without lesions. Oral mucosa is moist and without lesions. Tongue is normal in shape, size, and color and without lesions NECK: supple, trachea midline, no neck masses, no thyroid tenderness, no thyromegaly LYMPHATICS: no LAN in the neck, no supraclavicular LAN RESPIRATORY: breathing is even & unlabored, BS CTAB CARDIAC: RRR, no murmur,no extra heart sounds, no edema GI: abdomen soft, normal BS, no masses, no tenderness, no hepatomegaly, no splenomegaly EXTREMITIES:  Able to move X 4 extremities PSYCHIATRIC: Alert and oriented X 3. Affect and behavior are appropriate  LABS/RADIOLOGY: Labs reviewed: Basic Metabolic Panel:  Recent Labs  16/12/9608/16/17 0737  01/04/16 0611  01/05/16 0318 01/06/16 0629 01/10/16 01/16/16  NA  --   < > 136  --  136 136 139  139 137  K  --   < > 4.0  --  3.4* 3.3* 4.4  4.4 4.3  CL  --   < > 104  --  101 101  --   --   CO2  --   < > 20*  --  27 26  --   --   GLUCOSE  --   < > 110*  --  113* 105*  --   --   BUN  --   < > 11  --  10 8 11  11 10   CREATININE  --   < > 0.69  < > 0.52 0.50 0.5  0.5 0.5  CALCIUM  --   < > 7.8*  --  7.8* 7.9*  --   --   MG 1.4*  --  1.6*  --  1.9  --   --    --   < > = values in this interval not displayed.  CBC:  Recent Labs  12/31/15 1625  01/04/16 1236 01/05/16 0318 01/06/16 0629 01/10/16 01/16/16 01/18/16  WBC 8.8  < > 9.3 7.1 7.3 8.2  8.2 5.9 6.5  NEUTROABS 7.1  --   --   --   --  5  --   --  HGB 13.6  < > 9.8* 8.9* 9.2* 8.1*  8.1* 8.4* 8.6*  HCT 39.2  < > 28.7* 25.9* 27.2* 25*  25* 26* 27*  MCV 100.0  < > 100.3* 98.9 100.4*  --   --   --   PLT 290  < > 281 268 277 490*  490* 479* 524*  < > = values in this interval not displayed.    Dg Pelvis Portable  Result Date: 01/01/2016 CLINICAL DATA:  Closed displaced supracondylar fracture of the left femur. EXAM: PORTABLE PELVIS 1-2 VIEWS COMPARISON:  06/29/2005 acute abdominal series FINDINGS: No evidence of fracture or diastasis. Both hips are located and appear intact. Osteopenia and atherosclerosis. L4-5 asymmetric right disc narrowing. IMPRESSION: No acute finding. Electronically Signed   By: Marnee SpringJonathon  Watts M.D.   On: 01/01/2016 14:54   Dg C-arm 61-120 Min  Result Date: 01/03/2016 CLINICAL DATA:  Open reduction and internal fixation of left distal femur fracture. EXAM: LEFT FEMUR 2 VIEWS; DG C-ARM 61-120 MIN Fluoroscopy time:  2 minutes 25 seconds. COMPARISON:  Radiographs of December 31, 2015. FINDINGS: Four intraoperative fluoroscopic images retained at the distal left femur. These images demonstrate internal fixation of distal left femoral fracture. Good alignment of fracture components is noted. IMPRESSION: Status post surgical internal fixation of distal left femoral fracture. Electronically Signed   By: Lupita RaiderJames  Green Jr, M.D.   On: 01/03/2016 21:00   Dg Femur Min 2 Views Left  Result Date: 01/03/2016 CLINICAL DATA:  Open reduction and internal fixation of left distal femur fracture. EXAM: LEFT FEMUR 2 VIEWS; DG C-ARM 61-120 MIN Fluoroscopy time:  2 minutes 25 seconds. COMPARISON:  Radiographs of December 31, 2015. FINDINGS: Four intraoperative fluoroscopic images retained  at the distal left femur. These images demonstrate internal fixation of distal left femoral fracture. Good alignment of fracture components is noted. IMPRESSION: Status post surgical internal fixation of distal left femoral fracture. Electronically Signed   By: Lupita RaiderJames  Green Jr, M.D.   On: 01/03/2016 21:00   Dg Femur Port Min 2 Views Left  Result Date: 01/03/2016 CLINICAL DATA:  Postop ORIF. EXAM: LEFT FEMUR PORTABLE 2 VIEWS COMPARISON:  Intra operative images same day FINDINGS: There is plate and screw fixation of a comminuted distal femur fracture. Near anatomic restoration. Articular surfaces appear restored. IMPRESSION: Good appearance following ORIF. Electronically Signed   By: Paulina FusiMark  Shogry M.D.   On: 01/03/2016 21:09    ASSESSMENT/PLAN:  Unsteady gait - for Home health Nursing,PT and OT, for therapeutic strengthening exercises; fall precaution  Closed displaced supracondylar fracture of distal end of left femur with intracondylar extension S/P ORIF of distal femur fracture - for Home health Nurse, PT and OT, for therapeutic strengthening exercises; continue Lovenox 40 mg SQ daily for DVT prophylaxis; Oxycodone 5 mg 1-2 tabs PO Q 4 hours PRN and Tylenol ES 500 mg 1 tab PO Q 8 hours PRN for pain;   follow-up with Dr. Samson FredericBrian Swinteck, orthopedic surgeon; will go home with Lovenox prescriptions and will clarify length of therapy upon follow-up with orthopedics  Anemia, acute blood loss - was started on FeSO4 but was intolerant to it so it was discontinued Lab Results  Component Value Date   HGB 8.6 (A) 01/18/2016   Hypokalemia - resolved  Lab Results  Component Value Date   K 4.3 01/16/2016   Hypomagnesemia - 1.9 ; continue Magnesium Oxide 400 mg 1 tab PO Q D  Constipation - continue Senna-S 8.6-50 mg 2 tabs PO BID and  Miralax 17 gm PO BID      I have filled out patient's discharge paperwork and written prescriptions.  Patient will receive home health PT, OT and Child psychotherapist.  DME  provided:  None  Total discharge time: Greater than 30 minutes Greater than 50% was spent in counseling and coordination of care with the patient.  Discharge time involved coordination of the discharge process with social worker, nursing staff and therapy department. Medical justification for home health services verified.   Kenard Gower, NP [employer] [phone]

## 2016-02-03 DIAGNOSIS — Z9181 History of falling: Secondary | ICD-10-CM | POA: Diagnosis not present

## 2016-02-03 DIAGNOSIS — S72412D Displaced unspecified condyle fracture of lower end of left femur, subsequent encounter for closed fracture with routine healing: Secondary | ICD-10-CM | POA: Diagnosis not present

## 2016-02-03 DIAGNOSIS — M810 Age-related osteoporosis without current pathological fracture: Secondary | ICD-10-CM | POA: Diagnosis not present

## 2016-05-08 ENCOUNTER — Other Ambulatory Visit: Payer: Self-pay | Admitting: Orthopedic Surgery

## 2016-05-08 DIAGNOSIS — S22080A Wedge compression fracture of T11-T12 vertebra, initial encounter for closed fracture: Secondary | ICD-10-CM

## 2016-07-17 ENCOUNTER — Other Ambulatory Visit: Payer: Self-pay | Admitting: Family Medicine

## 2016-07-17 DIAGNOSIS — Z1231 Encounter for screening mammogram for malignant neoplasm of breast: Secondary | ICD-10-CM

## 2016-08-14 ENCOUNTER — Ambulatory Visit
Admission: RE | Admit: 2016-08-14 | Discharge: 2016-08-14 | Disposition: A | Discharge status: Home / Self Care | Payer: Medicare Other | Source: Ambulatory Visit | Attending: Family Medicine | Admitting: Family Medicine

## 2016-08-14 DIAGNOSIS — Z1231 Encounter for screening mammogram for malignant neoplasm of breast: Secondary | ICD-10-CM

## 2017-12-06 ENCOUNTER — Inpatient Hospital Stay (HOSPITAL_COMMUNITY)
Admission: EM | Admit: 2017-12-06 | Discharge: 2017-12-17 | DRG: 515 | Disposition: A | Discharge status: Rehabilitation Facility | Payer: Medicare Other | Attending: Student | Admitting: Student

## 2017-12-06 ENCOUNTER — Encounter (HOSPITAL_COMMUNITY): Payer: Self-pay | Admitting: Emergency Medicine

## 2017-12-06 ENCOUNTER — Other Ambulatory Visit: Payer: Self-pay

## 2017-12-06 ENCOUNTER — Emergency Department (HOSPITAL_COMMUNITY): Payer: Medicare Other

## 2017-12-06 ENCOUNTER — Inpatient Hospital Stay (HOSPITAL_COMMUNITY): Payer: Medicare Other

## 2017-12-06 DIAGNOSIS — S329XXA Fracture of unspecified parts of lumbosacral spine and pelvis, initial encounter for closed fracture: Secondary | ICD-10-CM

## 2017-12-06 DIAGNOSIS — S32302A Unspecified fracture of left ilium, initial encounter for closed fracture: Secondary | ICD-10-CM | POA: Diagnosis present

## 2017-12-06 DIAGNOSIS — F17210 Nicotine dependence, cigarettes, uncomplicated: Secondary | ICD-10-CM | POA: Diagnosis present

## 2017-12-06 DIAGNOSIS — D62 Acute posthemorrhagic anemia: Secondary | ICD-10-CM | POA: Diagnosis not present

## 2017-12-06 DIAGNOSIS — Y92410 Unspecified street and highway as the place of occurrence of the external cause: Secondary | ICD-10-CM | POA: Diagnosis not present

## 2017-12-06 DIAGNOSIS — K567 Ileus, unspecified: Secondary | ICD-10-CM | POA: Diagnosis not present

## 2017-12-06 DIAGNOSIS — S32810A Multiple fractures of pelvis with stable disruption of pelvic ring, initial encounter for closed fracture: Secondary | ICD-10-CM | POA: Diagnosis present

## 2017-12-06 DIAGNOSIS — E876 Hypokalemia: Secondary | ICD-10-CM | POA: Diagnosis present

## 2017-12-06 DIAGNOSIS — S32402A Unspecified fracture of left acetabulum, initial encounter for closed fracture: Secondary | ICD-10-CM | POA: Diagnosis present

## 2017-12-06 DIAGNOSIS — R402253 Coma scale, best verbal response, oriented, at hospital admission: Secondary | ICD-10-CM | POA: Diagnosis present

## 2017-12-06 DIAGNOSIS — H9190 Unspecified hearing loss, unspecified ear: Secondary | ICD-10-CM | POA: Diagnosis present

## 2017-12-06 DIAGNOSIS — S060X0A Concussion without loss of consciousness, initial encounter: Secondary | ICD-10-CM | POA: Diagnosis present

## 2017-12-06 DIAGNOSIS — D696 Thrombocytopenia, unspecified: Secondary | ICD-10-CM | POA: Diagnosis not present

## 2017-12-06 DIAGNOSIS — S32591A Other specified fracture of right pubis, initial encounter for closed fracture: Secondary | ICD-10-CM | POA: Diagnosis present

## 2017-12-06 DIAGNOSIS — I10 Essential (primary) hypertension: Secondary | ICD-10-CM

## 2017-12-06 DIAGNOSIS — R402363 Coma scale, best motor response, obeys commands, at hospital admission: Secondary | ICD-10-CM | POA: Diagnosis present

## 2017-12-06 DIAGNOSIS — S32810S Multiple fractures of pelvis with stable disruption of pelvic ring, sequela: Secondary | ICD-10-CM | POA: Diagnosis not present

## 2017-12-06 DIAGNOSIS — R339 Retention of urine, unspecified: Secondary | ICD-10-CM | POA: Diagnosis present

## 2017-12-06 DIAGNOSIS — S22080G Wedge compression fracture of T11-T12 vertebra, subsequent encounter for fracture with delayed healing: Secondary | ICD-10-CM

## 2017-12-06 DIAGNOSIS — S42022A Displaced fracture of shaft of left clavicle, initial encounter for closed fracture: Secondary | ICD-10-CM | POA: Diagnosis present

## 2017-12-06 DIAGNOSIS — R402143 Coma scale, eyes open, spontaneous, at hospital admission: Secondary | ICD-10-CM | POA: Diagnosis present

## 2017-12-06 DIAGNOSIS — Z7983 Long term (current) use of bisphosphonates: Secondary | ICD-10-CM

## 2017-12-06 DIAGNOSIS — G8918 Other acute postprocedural pain: Secondary | ICD-10-CM

## 2017-12-06 DIAGNOSIS — S3210XA Unspecified fracture of sacrum, initial encounter for closed fracture: Secondary | ICD-10-CM | POA: Diagnosis present

## 2017-12-06 DIAGNOSIS — Z419 Encounter for procedure for purposes other than remedying health state, unspecified: Secondary | ICD-10-CM

## 2017-12-06 DIAGNOSIS — Z79899 Other long term (current) drug therapy: Secondary | ICD-10-CM | POA: Diagnosis not present

## 2017-12-06 DIAGNOSIS — S32810G Multiple fractures of pelvis with stable disruption of pelvic ring, subsequent encounter for fracture with delayed healing: Secondary | ICD-10-CM | POA: Diagnosis not present

## 2017-12-06 DIAGNOSIS — E875 Hyperkalemia: Secondary | ICD-10-CM | POA: Diagnosis not present

## 2017-12-06 DIAGNOSIS — K9189 Other postprocedural complications and disorders of digestive system: Secondary | ICD-10-CM | POA: Diagnosis not present

## 2017-12-06 DIAGNOSIS — S2220XA Unspecified fracture of sternum, initial encounter for closed fracture: Secondary | ICD-10-CM | POA: Diagnosis present

## 2017-12-06 DIAGNOSIS — R000 Tachycardia, unspecified: Secondary | ICD-10-CM

## 2017-12-06 DIAGNOSIS — S42022S Displaced fracture of shaft of left clavicle, sequela: Secondary | ICD-10-CM | POA: Diagnosis not present

## 2017-12-06 DIAGNOSIS — R0989 Other specified symptoms and signs involving the circulatory and respiratory systems: Secondary | ICD-10-CM | POA: Diagnosis not present

## 2017-12-06 DIAGNOSIS — R140 Abdominal distension (gaseous): Secondary | ICD-10-CM

## 2017-12-06 DIAGNOSIS — R413 Other amnesia: Secondary | ICD-10-CM | POA: Diagnosis present

## 2017-12-06 DIAGNOSIS — R350 Frequency of micturition: Secondary | ICD-10-CM | POA: Diagnosis not present

## 2017-12-06 DIAGNOSIS — M810 Age-related osteoporosis without current pathological fracture: Secondary | ICD-10-CM | POA: Diagnosis present

## 2017-12-06 DIAGNOSIS — S32409A Unspecified fracture of unspecified acetabulum, initial encounter for closed fracture: Secondary | ICD-10-CM

## 2017-12-06 DIAGNOSIS — T07XXXA Unspecified multiple injuries, initial encounter: Secondary | ICD-10-CM | POA: Diagnosis not present

## 2017-12-06 DIAGNOSIS — K56609 Unspecified intestinal obstruction, unspecified as to partial versus complete obstruction: Secondary | ICD-10-CM

## 2017-12-06 LAB — I-STAT CHEM 8, ED
BUN: 7 mg/dL — AB (ref 8–23)
CHLORIDE: 103 mmol/L (ref 98–111)
CREATININE: 0.5 mg/dL (ref 0.44–1.00)
Calcium, Ion: 1.11 mmol/L — ABNORMAL LOW (ref 1.15–1.40)
GLUCOSE: 111 mg/dL — AB (ref 70–99)
HEMATOCRIT: 37 % (ref 36.0–46.0)
Hemoglobin: 12.6 g/dL (ref 12.0–15.0)
POTASSIUM: 4.1 mmol/L (ref 3.5–5.1)
Sodium: 136 mmol/L (ref 135–145)
TCO2: 26 mmol/L (ref 22–32)

## 2017-12-06 LAB — CBC
HEMATOCRIT: 38.2 % (ref 36.0–46.0)
HEMOGLOBIN: 12.2 g/dL (ref 12.0–15.0)
MCH: 31.9 pg (ref 26.0–34.0)
MCHC: 31.9 g/dL (ref 30.0–36.0)
MCV: 99.7 fL (ref 78.0–100.0)
Platelets: 211 10*3/uL (ref 150–400)
RBC: 3.83 MIL/uL — ABNORMAL LOW (ref 3.87–5.11)
RDW: 14.3 % (ref 11.5–15.5)
WBC: 14.6 10*3/uL — ABNORMAL HIGH (ref 4.0–10.5)

## 2017-12-06 MED ORDER — MORPHINE SULFATE (PF) 2 MG/ML IV SOLN
2.0000 mg | INTRAVENOUS | Status: DC | PRN
  Administered 2017-12-07: 2 mg via INTRAVENOUS
  Filled 2017-12-06 (×2): qty 1

## 2017-12-06 MED ORDER — METHOCARBAMOL 500 MG PO TABS
500.0000 mg | ORAL_TABLET | Freq: Three times a day (TID) | ORAL | Status: DC
  Administered 2017-12-06 – 2017-12-09 (×9): 500 mg via ORAL
  Filled 2017-12-06 (×10): qty 1

## 2017-12-06 MED ORDER — CALCIUM CITRATE-VITAMIN D 500-500 MG-UNIT PO CHEW
1.0000 | CHEWABLE_TABLET | Freq: Every day | ORAL | Status: DC
  Filled 2017-12-06 (×2): qty 1

## 2017-12-06 MED ORDER — TRAMADOL HCL 50 MG PO TABS
50.0000 mg | ORAL_TABLET | Freq: Four times a day (QID) | ORAL | Status: DC | PRN
  Administered 2017-12-06 – 2017-12-07 (×2): 50 mg via ORAL
  Filled 2017-12-06 (×2): qty 1

## 2017-12-06 MED ORDER — OXYCODONE HCL 5 MG PO TABS: 5.0000 mg | ORAL_TABLET | ORAL | Status: DC | PRN

## 2017-12-06 MED ORDER — ACETAMINOPHEN 500 MG PO TABS
1000.0000 mg | ORAL_TABLET | Freq: Three times a day (TID) | ORAL | Status: DC
  Administered 2017-12-06 – 2017-12-17 (×31): 1000 mg via ORAL
  Filled 2017-12-06 (×32): qty 2

## 2017-12-06 MED ORDER — ONDANSETRON HCL 4 MG/2ML IJ SOLN
4.0000 mg | Freq: Four times a day (QID) | INTRAMUSCULAR | Status: DC | PRN
  Administered 2017-12-07: 4 mg via INTRAVENOUS
  Filled 2017-12-06: qty 2

## 2017-12-06 MED ORDER — FENTANYL CITRATE (PF) 100 MCG/2ML IJ SOLN
25.0000 ug | INTRAMUSCULAR | Status: DC | PRN
  Administered 2017-12-06: 25 ug via INTRAVENOUS
  Filled 2017-12-06: qty 2

## 2017-12-06 MED ORDER — CALCIUM CARBONATE-VITAMIN D 500-200 MG-UNIT PO TABS
1.0000 | ORAL_TABLET | Freq: Every day | ORAL | Status: DC
  Administered 2017-12-07 – 2017-12-17 (×10): 1 via ORAL
  Filled 2017-12-06 (×11): qty 1

## 2017-12-06 MED ORDER — ENOXAPARIN SODIUM 40 MG/0.4ML ~~LOC~~ SOLN
40.0000 mg | SUBCUTANEOUS | Status: DC
  Administered 2017-12-06 – 2017-12-07 (×2): 40 mg via SUBCUTANEOUS
  Filled 2017-12-06 (×3): qty 0.4

## 2017-12-06 MED ORDER — POTASSIUM CHLORIDE IN NACL 20-0.45 MEQ/L-% IV SOLN
INTRAVENOUS | Status: DC
  Administered 2017-12-06 – 2017-12-10 (×5): via INTRAVENOUS
  Filled 2017-12-06 (×5): qty 1000

## 2017-12-06 MED ORDER — SODIUM CHLORIDE 0.9 % IV BOLUS: 1000.0000 mL | Freq: Once | INTRAVENOUS | Status: DC

## 2017-12-06 MED ORDER — IOHEXOL 300 MG/ML  SOLN
100.0000 mL | Freq: Once | INTRAMUSCULAR | Status: AC
  Administered 2017-12-06: 100 mL via INTRAVENOUS

## 2017-12-06 MED ORDER — METOPROLOL TARTRATE 5 MG/5ML IV SOLN: 5.0000 mg | Freq: Four times a day (QID) | INTRAVENOUS | Status: DC | PRN

## 2017-12-06 MED ORDER — MORPHINE SULFATE (PF) 4 MG/ML IV SOLN
4.0000 mg | Freq: Once | INTRAVENOUS | Status: AC
  Administered 2017-12-06: 4 mg via INTRAVENOUS
  Filled 2017-12-06: qty 1

## 2017-12-06 MED ORDER — POLYETHYLENE GLYCOL 3350 17 G PO PACK: 17.0000 g | PACK | Freq: Every day | ORAL | Status: DC | PRN

## 2017-12-06 MED ORDER — ONDANSETRON 4 MG PO TBDP: 4.0000 mg | ORAL_TABLET | Freq: Four times a day (QID) | ORAL | Status: DC | PRN

## 2017-12-06 MED ORDER — DOCUSATE SODIUM 100 MG PO CAPS
100.0000 mg | ORAL_CAPSULE | Freq: Two times a day (BID) | ORAL | Status: DC
  Administered 2017-12-06 – 2017-12-16 (×16): 100 mg via ORAL
  Filled 2017-12-06 (×20): qty 1

## 2017-12-06 NOTE — Progress Notes (Signed)
Pt had redness on her bottom PTA, sacral foam in place.
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
## 2017-12-06 NOTE — ED Notes (Signed)
Patient transported to CT 

## 2017-12-06 NOTE — Progress Notes (Signed)
RN called ED nurse back and tried to get report no answer will try again soon
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
## 2017-12-07 LAB — BASIC METABOLIC PANEL
Anion gap: 8 (ref 5–15)
BUN: 12 mg/dL (ref 8–23)
CHLORIDE: 100 mmol/L (ref 98–111)
CO2: 25 mmol/L (ref 22–32)
CREATININE: 0.63 mg/dL (ref 0.44–1.00)
Calcium: 8 mg/dL — ABNORMAL LOW (ref 8.9–10.3)
GFR calc non Af Amer: >60 mL/min (ref >60)
Glucose, Bld: 138 mg/dL — ABNORMAL HIGH (ref 70–99)
POTASSIUM: 4.8 mmol/L (ref 3.5–5.1)
SODIUM: 133 mmol/L — AB (ref 135–145)

## 2017-12-07 LAB — CBC
HEMATOCRIT: 27.9 % — AB (ref 36.0–46.0)
HEMOGLOBIN: 9.1 g/dL — AB (ref 12.0–15.0)
MCH: 32.3 pg (ref 26.0–34.0)
MCHC: 32.6 g/dL (ref 30.0–36.0)
MCV: 98.9 fL (ref 78.0–100.0)
Platelets: 157 10*3/uL (ref 150–400)
RBC: 2.82 MIL/uL — AB (ref 3.87–5.11)
RDW: 14.3 % (ref 11.5–15.5)
WBC: 7.9 10*3/uL (ref 4.0–10.5)

## 2017-12-07 MED ORDER — TRAMADOL HCL 50 MG PO TABS
50.0000 mg | ORAL_TABLET | Freq: Four times a day (QID) | ORAL | Status: DC | PRN
  Administered 2017-12-07 – 2017-12-08 (×2): 50 mg via ORAL
  Filled 2017-12-07 (×2): qty 1

## 2017-12-07 MED ORDER — OXYCODONE HCL 5 MG PO TABS
5.0000 mg | ORAL_TABLET | ORAL | Status: DC | PRN
  Administered 2017-12-10: 10 mg via ORAL
  Filled 2017-12-07: qty 1
  Filled 2017-12-07: qty 2
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
## 2017-12-07 NOTE — Progress Notes (Signed)
Pt's K 4.8 and Na 133. Pharmacy was consulted about the IVF order of 0.45 NaCl w/KCl 20 mEq at 75. Per Pharmacy "it's ok to give."
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
## 2017-12-07 NOTE — Progress Notes (Signed)
RN paged PA on call again. awaiting call back. Pt still stating she is feeling dizzy.
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
## 2017-12-07 NOTE — Progress Notes (Addendum)
Rehab Admissions Coordinator Note:  Per OT recommendation, Patient was screened by Nanine MeansKelly Trystan [name] for appropriateness for an Inpatient Acute Rehab Consult.  At this time, we are recommending Inpatient Rehab consult. AC will contact MD regarding IP Rehab Consult Order.   Nanine MeansKelly Bettina Warn 12/07/2017, 11:45 AM  I can be reached at [phone].

## 2017-12-07 NOTE — Plan of Care (Signed)

## 2017-12-07 NOTE — Progress Notes (Signed)
Pt stated that she was nauseous and dizzy and this happens whenever she sits up in the bed or the chair for a long time. RN gave the patient zofran for the nausea and Paged [facility]. Awaiting call back.

## 2017-12-08 ENCOUNTER — Inpatient Hospital Stay (HOSPITAL_COMMUNITY): Payer: Medicare Other

## 2017-12-08 LAB — BASIC METABOLIC PANEL
ANION GAP: 10 (ref 5–15)
BUN: 12 mg/dL (ref 8–23)
CHLORIDE: 96 mmol/L — AB (ref 98–111)
CO2: 25 mmol/L (ref 22–32)
Calcium: 8.9 mg/dL (ref 8.9–10.3)
Creatinine, Ser: 0.59 mg/dL (ref 0.44–1.00)
GFR calc Af Amer: >60 mL/min (ref >60)
GFR calc non Af Amer: >60 mL/min (ref >60)
GLUCOSE: 121 mg/dL — AB (ref 70–99)
POTASSIUM: 4.4 mmol/L (ref 3.5–5.1)
Sodium: 131 mmol/L — ABNORMAL LOW (ref 135–145)

## 2017-12-08 LAB — PREPARE RBC (CROSSMATCH)

## 2017-12-08 LAB — CBC
HCT: 24.7 % — ABNORMAL LOW (ref 36.0–46.0)
Hemoglobin: 8.2 g/dL — ABNORMAL LOW (ref 12.0–15.0)
MCH: 32.4 pg (ref 26.0–34.0)
MCHC: 33.2 g/dL (ref 30.0–36.0)
MCV: 97.6 fL (ref 78.0–100.0)
Platelets: 122 10*3/uL — ABNORMAL LOW (ref 150–400)
RBC: 2.53 MIL/uL — AB (ref 3.87–5.11)
RDW: 14.2 % (ref 11.5–15.5)
WBC: 9.8 10*3/uL (ref 4.0–10.5)

## 2017-12-08 LAB — HEMOGLOBIN AND HEMATOCRIT, BLOOD
HEMATOCRIT: 28.3 % — AB (ref 36.0–46.0)
HEMOGLOBIN: 9.7 g/dL — AB (ref 12.0–15.0)

## 2017-12-08 LAB — SURGICAL PCR SCREEN
MRSA, PCR: NEGATIVE
STAPHYLOCOCCUS AUREUS: NEGATIVE

## 2017-12-08 MED ORDER — POVIDONE-IODINE 10 % EX SWAB: 2.0000 "application " | Freq: Once | CUTANEOUS | Status: DC

## 2017-12-08 MED ORDER — SODIUM CHLORIDE 0.9% IV SOLUTION
Freq: Once | INTRAVENOUS | Status: AC
  Administered 2017-12-08: 12:00:00 via INTRAVENOUS

## 2017-12-08 MED ORDER — CHLORHEXIDINE GLUCONATE 4 % EX LIQD
60.0000 mL | Freq: Once | CUTANEOUS | Status: AC
  Administered 2017-12-09: 4 via TOPICAL
  Filled 2017-12-08: qty 60

## 2017-12-08 MED ORDER — BISACODYL 10 MG RE SUPP
10.0000 mg | Freq: Once | RECTAL | Status: AC
  Administered 2017-12-08: 10 mg via RECTAL
  Filled 2017-12-08: qty 1

## 2017-12-08 MED ORDER — POLYETHYLENE GLYCOL 3350 17 G PO PACK
17.0000 g | PACK | Freq: Every day | ORAL | Status: DC
  Administered 2017-12-08 – 2017-12-14 (×6): 17 g via ORAL
  Filled 2017-12-08 (×8): qty 1

## 2017-12-08 MED ORDER — CEFAZOLIN SODIUM-DEXTROSE 2-4 GM/100ML-% IV SOLN
2.0000 g | INTRAVENOUS | Status: AC
  Administered 2017-12-09: 2 g via INTRAVENOUS
  Filled 2017-12-08: qty 100

## 2017-12-08 NOTE — Plan of Care (Signed)

## 2017-12-08 NOTE — Progress Notes (Signed)
Pt completed 1 unit PRBC transfusion, no adverse reactions noted, tolerated well. Will continue to monitor.

## 2017-12-08 NOTE — Plan of Care (Signed)

## 2017-12-08 NOTE — Progress Notes (Signed)
Started 1 unit PRBC, Pt denies itching, chest pain, no SOB, no chills.
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
## 2017-12-09 ENCOUNTER — Inpatient Hospital Stay (HOSPITAL_COMMUNITY): Payer: Medicare Other | Admitting: Anesthesiology

## 2017-12-09 ENCOUNTER — Encounter (HOSPITAL_COMMUNITY): Payer: Self-pay | Admitting: Anesthesiology

## 2017-12-09 ENCOUNTER — Inpatient Hospital Stay (HOSPITAL_COMMUNITY): Payer: Medicare Other

## 2017-12-09 ENCOUNTER — Encounter (HOSPITAL_COMMUNITY): Admission: EM | Disposition: A | Discharge status: Rehabilitation Facility | Payer: Self-pay | Source: Home / Self Care

## 2017-12-09 HISTORY — PX: ORIF CLAVICULAR FRACTURE: SHX5055

## 2017-12-09 HISTORY — PX: ORIF PELVIC FRACTURE: SHX2128

## 2017-12-09 LAB — BASIC METABOLIC PANEL WITH GFR
Anion gap: 10 (ref 5–15)
BUN: 8 mg/dL (ref 8–23)
CO2: 25 mmol/L (ref 22–32)
Calcium: 8.3 mg/dL — ABNORMAL LOW (ref 8.9–10.3)
Chloride: 99 mmol/L (ref 98–111)
Creatinine, Ser: 0.56 mg/dL (ref 0.44–1.00)
GFR calc Af Amer: >60 mL/min
GFR calc non Af Amer: >60 mL/min
Glucose, Bld: 92 mg/dL (ref 70–99)
Potassium: 4 mmol/L (ref 3.5–5.1)
Sodium: 134 mmol/L — ABNORMAL LOW (ref 135–145)

## 2017-12-09 LAB — CBC
HEMATOCRIT: 27.1 % — AB (ref 36.0–46.0)
HEMOGLOBIN: 9.3 g/dL — AB (ref 12.0–15.0)
MCH: 32.4 pg (ref 26.0–34.0)
MCHC: 34.3 g/dL (ref 30.0–36.0)
MCV: 94.4 fL (ref 78.0–100.0)
Platelets: 94 10*3/uL — ABNORMAL LOW (ref 150–400)
RBC: 2.87 MIL/uL — AB (ref 3.87–5.11)
RDW: 14.6 % (ref 11.5–15.5)
WBC: 8.5 10*3/uL (ref 4.0–10.5)

## 2017-12-09 LAB — PREPARE RBC (CROSSMATCH)

## 2017-12-09 SURGERY — OPEN REDUCTION INTERNAL FIXATION (ORIF) CLAVICULAR FRACTURE
Anesthesia: General | Site: Shoulder | Laterality: Left

## 2017-12-09 MED ORDER — CEFAZOLIN SODIUM-DEXTROSE 2-4 GM/100ML-% IV SOLN
2.0000 g | Freq: Three times a day (TID) | INTRAVENOUS | Status: AC
  Administered 2017-12-09 – 2017-12-10 (×3): 2 g via INTRAVENOUS
  Filled 2017-12-09 (×3): qty 100

## 2017-12-09 MED ORDER — ONDANSETRON HCL 4 MG/2ML IJ SOLN
INTRAMUSCULAR | Status: AC
  Filled 2017-12-09: qty 4

## 2017-12-09 MED ORDER — PROMETHAZINE HCL 25 MG/ML IJ SOLN: 6.2500 mg | INTRAMUSCULAR | Status: DC | PRN

## 2017-12-09 MED ORDER — SUGAMMADEX SODIUM 500 MG/5ML IV SOLN
INTRAVENOUS | Status: DC | PRN
  Administered 2017-12-09: 95 mg via INTRAVENOUS

## 2017-12-09 MED ORDER — LIDOCAINE 2% (20 MG/ML) 5 ML SYRINGE
INTRAMUSCULAR | Status: AC
  Filled 2017-12-09: qty 10

## 2017-12-09 MED ORDER — TOBRAMYCIN SULFATE 1.2 G IJ SOLR
INTRAMUSCULAR | Status: DC | PRN
  Administered 2017-12-09: 1.2 g via TOPICAL

## 2017-12-09 MED ORDER — FENTANYL CITRATE (PF) 250 MCG/5ML IJ SOLN
INTRAMUSCULAR | Status: AC
  Filled 2017-12-09: qty 5

## 2017-12-09 MED ORDER — PROPOFOL 10 MG/ML IV BOLUS
INTRAVENOUS | Status: AC
  Filled 2017-12-09: qty 20

## 2017-12-09 MED ORDER — PROPOFOL 10 MG/ML IV BOLUS
INTRAVENOUS | Status: DC | PRN
  Administered 2017-12-09: 70 mg via INTRAVENOUS

## 2017-12-09 MED ORDER — DEXAMETHASONE SODIUM PHOSPHATE 10 MG/ML IJ SOLN
INTRAMUSCULAR | Status: DC | PRN
  Administered 2017-12-09: 10 mg via INTRAVENOUS

## 2017-12-09 MED ORDER — VANCOMYCIN HCL 1000 MG IV SOLR
INTRAVENOUS | Status: AC
  Filled 2017-12-09: qty 2000

## 2017-12-09 MED ORDER — LIDOCAINE 2% (20 MG/ML) 5 ML SYRINGE
INTRAMUSCULAR | Status: DC | PRN
  Administered 2017-12-09: 60 mg via INTRAVENOUS

## 2017-12-09 MED ORDER — VANCOMYCIN HCL 1000 MG IV SOLR
INTRAVENOUS | Status: DC | PRN
  Administered 2017-12-09 (×2): 1 g via TOPICAL

## 2017-12-09 MED ORDER — TOBRAMYCIN SULFATE 1.2 G IJ SOLR
INTRAMUSCULAR | Status: AC
  Filled 2017-12-09: qty 1.2

## 2017-12-09 MED ORDER — DEXAMETHASONE SODIUM PHOSPHATE 10 MG/ML IJ SOLN
INTRAMUSCULAR | Status: AC
  Filled 2017-12-09: qty 2

## 2017-12-09 MED ORDER — FENTANYL CITRATE (PF) 100 MCG/2ML IJ SOLN
INTRAMUSCULAR | Status: AC
  Filled 2017-12-09: qty 2

## 2017-12-09 MED ORDER — ROCURONIUM BROMIDE 50 MG/5ML IV SOSY
PREFILLED_SYRINGE | INTRAVENOUS | Status: AC
  Filled 2017-12-09: qty 5

## 2017-12-09 MED ORDER — FENTANYL CITRATE (PF) 100 MCG/2ML IJ SOLN
25.0000 ug | INTRAMUSCULAR | Status: DC | PRN
  Administered 2017-12-09: 50 ug via INTRAVENOUS
  Administered 2017-12-09 (×2): 25 ug via INTRAVENOUS

## 2017-12-09 MED ORDER — ONDANSETRON HCL 4 MG/2ML IJ SOLN
INTRAMUSCULAR | Status: DC | PRN
  Administered 2017-12-09: 4 mg via INTRAVENOUS

## 2017-12-09 MED ORDER — 0.9 % SODIUM CHLORIDE (POUR BTL) OPTIME
TOPICAL | Status: DC | PRN
  Administered 2017-12-09: 1000 mL

## 2017-12-09 MED ORDER — BISACODYL 10 MG RE SUPP
10.0000 mg | Freq: Every day | RECTAL | Status: DC | PRN
  Administered 2017-12-09 – 2017-12-10 (×2): 10 mg via RECTAL
  Filled 2017-12-09 (×2): qty 1

## 2017-12-09 MED ORDER — ROCURONIUM BROMIDE 50 MG/5ML IV SOSY
PREFILLED_SYRINGE | INTRAVENOUS | Status: DC | PRN
  Administered 2017-12-09: 20 mg via INTRAVENOUS
  Administered 2017-12-09 (×2): 10 mg via INTRAVENOUS
  Administered 2017-12-09: 50 mg via INTRAVENOUS

## 2017-12-09 MED ORDER — SODIUM CHLORIDE 0.9 % IV SOLN
INTRAVENOUS | Status: DC | PRN
  Administered 2017-12-09: 30 ug/min via INTRAVENOUS

## 2017-12-09 MED ORDER — LACTATED RINGERS IV SOLN
INTRAVENOUS | Status: DC | PRN
  Administered 2017-12-09 (×3): via INTRAVENOUS

## 2017-12-09 MED ORDER — LACTATED RINGERS IV SOLN
INTRAVENOUS | Status: DC
  Administered 2017-12-09: 09:00:00 via INTRAVENOUS

## 2017-12-09 MED ORDER — FENTANYL CITRATE (PF) 100 MCG/2ML IJ SOLN
INTRAMUSCULAR | Status: DC | PRN
  Administered 2017-12-09 (×8): 50 ug via INTRAVENOUS
  Administered 2017-12-09: 25 ug via INTRAVENOUS

## 2017-12-09 SURGICAL SUPPLY — 87 items
ADH SKN CLS APL DERMABOND .7 (GAUZE/BANDAGES/DRESSINGS) ×3
ADH SKN CLS LQ APL DERMABOND (GAUZE/BANDAGES/DRESSINGS) ×3
BIT DRILL CANN 4.5MM (BIT) ×1 IMPLANT
BIT DRILL LCP QC 2X140 (BIT) ×2 IMPLANT
BNDG COHESIVE 4X5 TAN STRL (GAUZE/BANDAGES/DRESSINGS) IMPLANT
BRUSH SCRUB SURG 4.25 DISP (MISCELLANEOUS) ×8 IMPLANT
CHLORAPREP W/TINT 26ML (MISCELLANEOUS) ×6 IMPLANT
COVER SURGICAL LIGHT HANDLE (MISCELLANEOUS) ×8 IMPLANT
DERMABOND ADHESIVE PROPEN (GAUZE/BANDAGES/DRESSINGS) ×1
DERMABOND ADVANCED (GAUZE/BANDAGES/DRESSINGS) ×1
DERMABOND ADVANCED .7 DNX12 (GAUZE/BANDAGES/DRESSINGS) ×5 IMPLANT
DERMABOND ADVANCED .7 DNX6 (GAUZE/BANDAGES/DRESSINGS) ×1 IMPLANT
DRAPE C-ARM 42X72 X-RAY (DRAPES) ×4 IMPLANT
DRAPE C-ARMOR (DRAPES) ×4 IMPLANT
DRAPE INCISE IOBAN 66X45 STRL (DRAPES) ×7 IMPLANT
DRAPE LAPAROTOMY TRNSV 102X78 (DRAPE) ×2 IMPLANT
DRAPE ORTHO SPLIT 77X108 STRL (DRAPES)
DRAPE SURG ORHT 6 SPLT 77X108 (DRAPES) ×4 IMPLANT
DRAPE U-SHAPE 47X51 STRL (DRAPES) ×4 IMPLANT
DRILL BIT CANN 4.5MM (BIT) ×1
DRSG MEPILEX BORDER 4X4 (GAUZE/BANDAGES/DRESSINGS) ×2 IMPLANT
DRSG MEPILEX BORDER 4X8 (GAUZE/BANDAGES/DRESSINGS) ×4 IMPLANT
DRSG TEGADERM 4X4.75 (GAUZE/BANDAGES/DRESSINGS) ×6 IMPLANT
ELECT BLADE 4.0 EZ CLEAN MEGAD (MISCELLANEOUS) ×4
ELECT REM PT RETURN 9FT ADLT (ELECTROSURGICAL) ×8
ELECTRODE BLDE 4.0 EZ CLN MEGD (MISCELLANEOUS) ×2 IMPLANT
ELECTRODE REM PT RTRN 9FT ADLT (ELECTROSURGICAL) ×4 IMPLANT
GAUZE SPONGE 4X4 12PLY STRL (GAUZE/BANDAGES/DRESSINGS) ×2 IMPLANT
GAUZE SPONGE 4X4 12PLY STRL LF (GAUZE/BANDAGES/DRESSINGS) ×2 IMPLANT
GLOVE BIO SURGEON STRL SZ7.5 (GLOVE) ×16 IMPLANT
GLOVE BIOGEL PI IND STRL 7.5 (GLOVE) ×6 IMPLANT
GLOVE BIOGEL PI INDICATOR 7.5 (GLOVE) ×4
GLOVE SURG SS PI 7.0 STRL IVOR (GLOVE) ×6 IMPLANT
GOWN STRL REUS W/ TWL LRG LVL3 (GOWN DISPOSABLE) ×10 IMPLANT
GOWN STRL REUS W/TWL LRG LVL3 (GOWN DISPOSABLE) ×24
GUIDEWIRE 2.0MM (WIRE) ×2 IMPLANT
GUIDEWIRE THREADED 2.8MM (WIRE) ×2 IMPLANT
KIT BASIN OR (CUSTOM PROCEDURE TRAY) ×4 IMPLANT
KIT TURNOVER KIT B (KITS) ×4 IMPLANT
LAWSON 10363 1.0MM K-WIRE ×4 IMPLANT
MANIFOLD NEPTUNE II (INSTRUMENTS) ×6 IMPLANT
NDL HYPO 25GX1X1/2 BEV (NEEDLE) IMPLANT
NEEDLE HYPO 25GX1X1/2 BEV (NEEDLE) IMPLANT
NS IRRIG 1000ML POUR BTL (IV SOLUTION) ×4 IMPLANT
PACK GENERAL/GYN (CUSTOM PROCEDURE TRAY) ×4 IMPLANT
PAD ARMBOARD 7.5X6 YLW CONV (MISCELLANEOUS) ×10 IMPLANT
PENCIL BUTTON HOLSTER BLD 10FT (ELECTRODE) ×2 IMPLANT
PLATE LOCK LCP F/2.7X94 10H (Plate) ×2 IMPLANT
PLATE PELVIS 8H (Plate) ×3 IMPLANT
SCREW CANN F THRED S 7.3X155 (Screw) ×2 IMPLANT
SCREW CORTEX 2.7 SLF-TPNG 16MM (Screw) ×6 IMPLANT
SCREW CORTEX 3.5 20MM (Screw) ×1 IMPLANT
SCREW CORTEX 3.5 22MM (Screw) ×1 IMPLANT
SCREW CORTEX 3.5 24MM (Screw) ×1 IMPLANT
SCREW CORTEX 3.5X40MM (Screw) ×2 IMPLANT
SCREW CORTEX 3.5X45MM (Screw) ×2 IMPLANT
SCREW LOCK CORT ST 3.5X20 (Screw) ×1 IMPLANT
SCREW LOCK CORT ST 3.5X22 (Screw) ×1 IMPLANT
SCREW LOCK CORT ST 3.5X24 (Screw) ×2 IMPLANT
SCREW LOCK ST 2.7X16 (Screw) ×4 IMPLANT
SCREW PELVIC CORT ST 3.5X55 (Screw) ×1 IMPLANT
SCREW SELF TAP 14MM (Screw) ×6 IMPLANT
SCREW SELF TAP 3.5 55M (Screw) ×1 IMPLANT
SLING ARM FOAM STRAP MED (SOFTGOODS) ×2 IMPLANT
SLING ARM IMMOBILIZER LRG (SOFTGOODS) IMPLANT
SLING ARM IMMOBILIZER MED (SOFTGOODS) IMPLANT
STAPLER VISISTAT 35W (STAPLE) ×4 IMPLANT
STOCKINETTE IMPERVIOUS 9X36 MD (GAUZE/BANDAGES/DRESSINGS) IMPLANT
SUCTION FRAZIER HANDLE 10FR (MISCELLANEOUS) ×2
SUCTION TUBE FRAZIER 10FR DISP (MISCELLANEOUS) ×4 IMPLANT
SUT ETHILON 3 0 PS 1 (SUTURE) ×2 IMPLANT
SUT MNCRL AB 3-0 PS2 18 (SUTURE) ×6 IMPLANT
SUT MNCRL AB 3-0 PS2 27 (SUTURE) ×2 IMPLANT
SUT VIC AB 0 CT1 27 (SUTURE) ×12
SUT VIC AB 0 CT1 27XBRD ANBCTR (SUTURE) ×5 IMPLANT
SUT VIC AB 1 CT1 27 (SUTURE)
SUT VIC AB 1 CT1 27XBRD ANBCTR (SUTURE) IMPLANT
SUT VIC AB 2-0 CT1 27 (SUTURE) ×4
SUT VIC AB 2-0 CT1 TAPERPNT 27 (SUTURE) ×3 IMPLANT
SUT VIC AB 2-0 FS1 27 (SUTURE) ×4 IMPLANT
SYR CONTROL 10ML LL (SYRINGE) IMPLANT
TOWEL OR 17X24 6PK STRL BLUE (TOWEL DISPOSABLE) ×4 IMPLANT
TOWEL OR 17X26 10 PK STRL BLUE (TOWEL DISPOSABLE) ×8 IMPLANT
TUBING BULK SUCTION (MISCELLANEOUS) ×2 IMPLANT
UNDERPAD 30X30 (UNDERPADS AND DIAPERS) ×2 IMPLANT
WATER STERILE IRR 1000ML POUR (IV SOLUTION) ×4 IMPLANT
YANKAUER SUCT BULB TIP NO VENT (SUCTIONS) ×2 IMPLANT

## 2017-12-09 NOTE — Progress Notes (Signed)
Patient examined this morning.  Still with significant left upper extremity and left lower extremity pelvic pain.  Patient appears significantly uncomfortable in bed.  Any lateral compression of the left side of her pelvis causes severe pain where she screams in pain.  I discussed with her proceeding with a surgical fixation of her clavicle and pelvis.  I discussed risks and benefits.  Risks included but not limited to bleeding, infection, malunion, nonunion, bowel or bladder injury, nerve or blood vessel injury, blood clot, DVT, need for revision surgery, failure of fixation, posttraumatic arthritis, and even the possibility of death.  However in light of these wrist the patient wishes to proceed with surgery as she states she cannot live like this.  I believe this will allow her to mobilize quicker and improve her pain control and allow for more reliable healing of both fractures and allow her to be weightbearing as tolerated to the left upper extremity.  We will plan to proceed with surgery today.  Roby LoftsKevin P. Willella Harding, MD [facility] [phone] (phone)

## 2017-12-09 NOTE — Anesthesia Postprocedure Evaluation (Signed)
Anesthesia Post Note  Patient: [name]  Procedure(s) Performed: OPEN REDUCTION INTERNAL FIXATION (ORIF) CLAVICULAR FRACTURE (Left Shoulder) OPEN REDUCTION INTERNAL FIXATION (ORIF) PELVIC FRACTURE (Left Pelvis)     Patient location during evaluation: PACU Anesthesia Type: General Level of consciousness: awake and alert Pain management: pain level controlled Vital Signs Assessment: post-procedure vital signs reviewed and stable Respiratory status: spontaneous breathing, nonlabored ventilation, respiratory function stable and patient connected to nasal cannula oxygen Cardiovascular status: blood pressure returned to baseline and stable Postop Assessment: no apparent nausea or vomiting Anesthetic complications: no    Last Vitals:  Vitals:   12/09/17 1518 12/09/17 1533  BP: (!) 158/82 (!) 161/76  Pulse: (!) 114 (!) 109  Resp: 17 14  Temp: 36.6 C   SpO2: 93% 98%    Last Pain:  Vitals:   12/09/17 0508  TempSrc: Oral  PainSc:                  [name]

## 2017-12-09 NOTE — Plan of Care (Signed)
  Problem: Education: Goal: Knowledge of General Education information will improve Description Including pain rating scale, medication(s)/side effects and non-pharmacologic comfort measures 12/09/2017 1923 by Beverely LowNevius, Jolene Guyett, RN Outcome: Progressing 12/09/2017 1921 by Beverely LowNevius, Meredeth Furber, RN Outcome: Progressing   Problem: Health Behavior/Discharge Planning: Goal: Ability to manage health-related needs will improve 12/09/2017 1923 by Beverely LowNevius, Anaiah Mcmannis, RN Outcome: Progressing 12/09/2017 1921 by Beverely LowNevius, Shenice Dolder, RN Outcome: Progressing   Problem: Clinical Measurements: Goal: Ability to maintain clinical measurements within normal limits will improve 12/09/2017 1923 by Beverely LowNevius, Brietta Manso, RN Outcome: Progressing 12/09/2017 1921 by Beverely LowNevius, Nakoa Ganus, RN Outcome: Progressing Goal: Will remain free from infection 12/09/2017 1923 by Beverely LowNevius, Pennye Beeghly, RN Outcome: Progressing 12/09/2017 1921 by Beverely LowNevius, Ollen Rao, RN Outcome: Progressing Goal: Diagnostic test results will improve 12/09/2017 1923 by Beverely LowNevius, Raynell Upton, RN Outcome: Progressing 12/09/2017 1921 by Beverely LowNevius, Kinaya Hilliker, RN Outcome: Progressing Goal: Respiratory complications will improve 12/09/2017 1923 by Beverely LowNevius, Jaxxen Voong, RN Outcome: Progressing 12/09/2017 1921 by Beverely LowNevius, Shoshana Johal, RN Outcome: Progressing Goal: Cardiovascular complication will be avoided 12/09/2017 1923 by Beverely LowNevius, Tehran Rabenold, RN Outcome: Progressing 12/09/2017 1921 by Beverely LowNevius, Xayvion Shirah, RN Outcome: Progressing   Problem: Activity: Goal: Risk for activity intolerance will decrease 12/09/2017 1923 by Beverely LowNevius, Eldin Bonsell, RN Outcome: Progressing 12/09/2017 1921 by Beverely LowNevius, Lenin Kuhnle, RN Outcome: Progressing   Problem: Nutrition: Goal: Adequate nutrition will be maintained 12/09/2017 1923 by Beverely LowNevius, Rozetta Stumpp, RN Outcome: Progressing 12/09/2017 1921 by Beverely LowNevius, Johnnisha Forton, RN Outcome: Progressing   Problem: Coping: Goal: Level of anxiety will decrease 12/09/2017 1923 by Beverely LowNevius, Brihany Butch, RN Outcome:  Progressing 12/09/2017 1921 by Beverely LowNevius, Leyani Gargus, RN Outcome: Progressing   Problem: Elimination: Goal: Will not experience complications related to bowel motility 12/09/2017 1923 by Beverely LowNevius, Seeley Hissong, RN Outcome: Progressing 12/09/2017 1921 by Beverely LowNevius, Chrishawna Farina, RN Outcome: Progressing Goal: Will not experience complications related to urinary retention 12/09/2017 1923 by Beverely LowNevius, Othar Curto, RN Outcome: Progressing 12/09/2017 1921 by Beverely LowNevius, Yiannis Tulloch, RN Outcome: Progressing   Problem: Pain Managment: Goal: General experience of comfort will improve 12/09/2017 1923 by Beverely LowNevius, Santiana Glidden, RN Outcome: Progressing 12/09/2017 1921 by Beverely LowNevius, Sayvion Vigen, RN Outcome: Progressing   Problem: Safety: Goal: Ability to remain free from injury will improve 12/09/2017 1923 by Beverely LowNevius, Leyana Whidden, RN Outcome: Progressing 12/09/2017 1921 by Beverely LowNevius, Kincade Granberg, RN Outcome: Progressing   Problem: Skin Integrity: Goal: Risk for impaired skin integrity will decrease 12/09/2017 1923 by Beverely LowNevius, Baylynn Shifflett, RN Outcome: Progressing 12/09/2017 1921 by Beverely LowNevius, Michiah Masse, RN Outcome: Progressing

## 2017-12-09 NOTE — Progress Notes (Addendum)
Thank you for consult on [name]. Chart reveiwed and note that patient with inability to tolerate any activity due to pain and currently in surgery. Will await therapy evaluations to help determine appropriate rehab venue.
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
## 2017-12-09 NOTE — Plan of Care (Signed)
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
## 2017-12-09 NOTE — Care Management Important Message (Signed)
Important Message  Patient Details  Name: Heidi RossettiKarin Walla MRN: [id number] Date of Birth: 03/31/1945   Medicare Important Message Given:  Yes    Othon Guardia Stefan ChurchBratton 12/09/2017, 2:50 PM

## 2017-12-09 NOTE — Progress Notes (Signed)
Patient very antsy and appears uncomfortable, unable to tell me what is wrong. She is pulling on blankets and her sling and trying to take her arm out of the sling. Spoke to Dr. Michelle Piperssey who wanted to give her more time to see if she would improve. Will continue to monitor.

## 2017-12-09 NOTE — Anesthesia Preprocedure Evaluation (Addendum)
Anesthesia Evaluation  Patient identified by MRN, date of birth, ID band Patient awake    Reviewed: Allergy & Precautions, NPO status , Patient's Chart, lab work & pertinent test results  Airway Mallampati: II  TM Distance: >3 FB Neck ROM: Full    Dental  (+) Dental Advisory Given, Teeth Intact   Pulmonary Current Smoker,    breath sounds clear to auscultation       Cardiovascular negative cardio ROS   Rhythm:Regular Rate:Normal     Neuro/Psych negative neurological ROS     GI/Hepatic negative GI ROS, Neg liver ROS,   Endo/Other  negative endocrine ROS  Renal/GU negative Renal ROS     Musculoskeletal   Abdominal   Peds  Hematology negative hematology ROS (+)   Anesthesia Other Findings   Reproductive/Obstetrics                            Lab Results  Component Value Date   WBC 8.5 12/09/2017   HGB 9.3 (L) 12/09/2017   HCT 27.1 (L) 12/09/2017   MCV 94.4 12/09/2017   PLT 94 (L) 12/09/2017   Lab Results  Component Value Date   CREATININE 0.56 12/09/2017   BUN 8 12/09/2017   NA 134 (L) 12/09/2017   K 4.0 12/09/2017   CL 99 12/09/2017   CO2 25 12/09/2017    Anesthesia Physical Anesthesia Plan  ASA: III  Anesthesia Plan: General   Post-op Pain Management:    Induction: Intravenous  PONV Risk Score and Plan: 2 and Ondansetron, Dexamethasone and Treatment may vary due to age or medical condition  Airway Management Planned: Oral ETT  Additional Equipment:   Intra-op Plan:   Post-operative Plan: Extubation in OR  Informed Consent: I have reviewed the patients History and Physical, chart, labs and discussed the procedure including the risks, benefits and alternatives for the proposed anesthesia with the patient or authorized representative who has indicated his/her understanding and acceptance.   Dental advisory given and Dental Advisory Given  Plan Discussed with:  CRNA  Anesthesia Plan Comments:       Anesthesia Quick Evaluation

## 2017-12-09 NOTE — Transfer of Care (Signed)
Immediate Anesthesia Transfer of Care Note  Patient: Heidi RossettiKarin Boyd  Procedure(s) Performed: OPEN REDUCTION INTERNAL FIXATION (ORIF) CLAVICULAR FRACTURE (Left Shoulder) OPEN REDUCTION INTERNAL FIXATION (ORIF) PELVIC FRACTURE (Left Pelvis)  Patient Location: PACU  Anesthesia Type:General  Level of Consciousness: awake and alert   Airway & Oxygen Therapy: Patient Spontanous Breathing and Patient connected to nasal cannula oxygen  Post-op Assessment: Report given to RN and Post -op Vital signs reviewed and stable  Post vital signs: Reviewed and stable  Last Vitals:  Vitals Value Taken Time  BP 158/82 12/09/2017  3:18 PM  Temp    Pulse 111 12/09/2017  3:23 PM  Resp 20 12/09/2017  3:23 PM  SpO2 95 % 12/09/2017  3:23 PM  Vitals shown include unvalidated device data.  Last Pain:  Vitals:   12/09/17 0508  TempSrc: Oral  PainSc:          Complications: No apparent anesthesia complications

## 2017-12-09 NOTE — Anesthesia Procedure Notes (Signed)
Procedure Name: Intubation Date/Time: 12/09/2017 10:42 AM Performed by: Neldon Newport, CRNA Pre-anesthesia Checklist: Timeout performed, Patient being monitored, Suction available, Emergency Drugs available and Patient identified Patient Re-evaluated:Patient Re-evaluated prior to induction Oxygen Delivery Method: Circle system utilized Preoxygenation: Pre-oxygenation with 100% oxygen Induction Type: IV induction Ventilation: Mask ventilation without difficulty Laryngoscope Size: Mac and 3 Grade View: Grade I Tube type: Oral Tube size: 6.5 mm Number of attempts: 1 Placement Confirmation: breath sounds checked- equal and bilateral,  positive ETCO2 and ETT inserted through vocal cords under direct vision Secured at: 21 cm Tube secured with: Tape Dental Injury: Teeth and Oropharynx as per pre-operative assessment

## 2017-12-10 ENCOUNTER — Inpatient Hospital Stay (HOSPITAL_COMMUNITY): Payer: Medicare Other

## 2017-12-10 ENCOUNTER — Encounter (HOSPITAL_COMMUNITY): Payer: Self-pay | Admitting: *Deleted

## 2017-12-10 DIAGNOSIS — G8918 Other acute postprocedural pain: Secondary | ICD-10-CM

## 2017-12-10 DIAGNOSIS — S32810A Multiple fractures of pelvis with stable disruption of pelvic ring, initial encounter for closed fracture: Secondary | ICD-10-CM

## 2017-12-10 DIAGNOSIS — D62 Acute posthemorrhagic anemia: Secondary | ICD-10-CM

## 2017-12-10 DIAGNOSIS — R000 Tachycardia, unspecified: Secondary | ICD-10-CM

## 2017-12-10 DIAGNOSIS — R339 Retention of urine, unspecified: Secondary | ICD-10-CM

## 2017-12-10 DIAGNOSIS — D696 Thrombocytopenia, unspecified: Secondary | ICD-10-CM

## 2017-12-10 DIAGNOSIS — I10 Essential (primary) hypertension: Secondary | ICD-10-CM

## 2017-12-10 DIAGNOSIS — R140 Abdominal distension (gaseous): Secondary | ICD-10-CM

## 2017-12-10 DIAGNOSIS — E875 Hyperkalemia: Secondary | ICD-10-CM

## 2017-12-10 LAB — BASIC METABOLIC PANEL
ANION GAP: 10 (ref 5–15)
ANION GAP: 13 (ref 5–15)
BUN: 6 mg/dL — ABNORMAL LOW (ref 8–23)
BUN: 6 mg/dL — ABNORMAL LOW (ref 8–23)
CHLORIDE: 100 mmol/L (ref 98–111)
CHLORIDE: 96 mmol/L — AB (ref 98–111)
CO2: 24 mmol/L (ref 22–32)
CO2: 25 mmol/L (ref 22–32)
Calcium: 7.8 mg/dL — ABNORMAL LOW (ref 8.9–10.3)
Calcium: 8.2 mg/dL — ABNORMAL LOW (ref 8.9–10.3)
Creatinine, Ser: 0.63 mg/dL (ref 0.44–1.00)
Creatinine, Ser: 0.66 mg/dL (ref 0.44–1.00)
GFR calc non Af Amer: >60 mL/min (ref >60)
GFR calc non Af Amer: >60 mL/min (ref >60)
GLUCOSE: 98 mg/dL (ref 70–99)
GLUCOSE: 122 mg/dL — AB (ref 70–99)
Potassium: 5.3 mmol/L — ABNORMAL HIGH (ref 3.5–5.1)
Potassium: 3.5 mmol/L (ref 3.5–5.1)
Sodium: 134 mmol/L — ABNORMAL LOW (ref 135–145)
Sodium: 134 mmol/L — ABNORMAL LOW (ref 135–145)

## 2017-12-10 LAB — HEPARIN INDUCED PLATELET AB (HIT ANTIBODY): HEPARIN INDUCED PLT AB: 0.179 {OD_unit} (ref 0.000–0.400)

## 2017-12-10 LAB — CBC
HEMATOCRIT: 26.3 % — AB (ref 36.0–46.0)
HEMOGLOBIN: 8.8 g/dL — AB (ref 12.0–15.0)
MCH: 32.6 pg (ref 26.0–34.0)
MCHC: 33.5 g/dL (ref 30.0–36.0)
MCV: 97.4 fL (ref 78.0–100.0)
Platelets: 121 10*3/uL — ABNORMAL LOW (ref 150–400)
RBC: 2.7 MIL/uL — ABNORMAL LOW (ref 3.87–5.11)
RDW: 15.2 % (ref 11.5–15.5)
WBC: 8.7 10*3/uL (ref 4.0–10.5)

## 2017-12-10 MED ORDER — SODIUM CHLORIDE 0.9 % IV SOLN
INTRAVENOUS | Status: DC
  Administered 2017-12-10 – 2017-12-15 (×4): via INTRAVENOUS

## 2017-12-10 MED ORDER — BISACODYL 10 MG RE SUPP: 10.0000 mg | Freq: Every day | RECTAL | Status: DC

## 2017-12-10 MED ORDER — BISACODYL 10 MG RE SUPP
10.0000 mg | Freq: Every day | RECTAL | Status: DC
  Administered 2017-12-11 – 2017-12-13 (×3): 10 mg via RECTAL
  Filled 2017-12-10 (×5): qty 1

## 2017-12-10 MED ORDER — GABAPENTIN 600 MG PO TABS
300.0000 mg | ORAL_TABLET | Freq: Three times a day (TID) | ORAL | Status: DC
  Administered 2017-12-10 – 2017-12-17 (×22): 300 mg via ORAL
  Filled 2017-12-10 (×22): qty 1

## 2017-12-10 MED ORDER — TRAMADOL HCL 50 MG PO TABS
100.0000 mg | ORAL_TABLET | Freq: Four times a day (QID) | ORAL | Status: DC
  Administered 2017-12-10 – 2017-12-11 (×4): 100 mg via ORAL
  Filled 2017-12-10 (×4): qty 2

## 2017-12-10 MED ORDER — METHOCARBAMOL 500 MG PO TABS
1000.0000 mg | ORAL_TABLET | Freq: Three times a day (TID) | ORAL | Status: DC
  Administered 2017-12-10 – 2017-12-13 (×9): 1000 mg via ORAL
  Filled 2017-12-10 (×9): qty 2

## 2017-12-10 MED ORDER — ENOXAPARIN SODIUM 30 MG/0.3ML ~~LOC~~ SOLN
30.0000 mg | SUBCUTANEOUS | Status: DC
  Administered 2017-12-10 – 2017-12-11 (×2): 30 mg via SUBCUTANEOUS
  Filled 2017-12-10 (×2): qty 0.3
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
## 2017-12-10 NOTE — Progress Notes (Signed)
Inpatient Rehabilitation-Admissions Coordinator   Met with pt at the bedside as follow up from PM&R consult. Pt unsure of what she wants regarding post acute care and unsure of her social support at [place].  Pt did give AC consent to discuss with her son and [facility] plans to follow up with pt's son and pt tomorrow regarding plans. AC left brochures and information for pt and her son in the room.   AC will continue to follow for therapy tolerance and pt's choice regarding post acute care venue.   Jhonnie Garner, OTR/L  Rehab Admissions Coordinator  [phone] 12/10/2017 6:01 PM

## 2017-12-11 ENCOUNTER — Inpatient Hospital Stay (HOSPITAL_COMMUNITY): Payer: Medicare Other

## 2017-12-11 ENCOUNTER — Encounter (HOSPITAL_COMMUNITY): Payer: Self-pay | Admitting: Student

## 2017-12-11 LAB — CBC
HEMATOCRIT: 25.2 % — AB (ref 36.0–46.0)
Hemoglobin: 8.5 g/dL — ABNORMAL LOW (ref 12.0–15.0)
MCH: 33.1 pg (ref 26.0–34.0)
MCHC: 33.7 g/dL (ref 30.0–36.0)
MCV: 98.1 fL (ref 78.0–100.0)
PLATELETS: 153 10*3/uL (ref 150–400)
RBC: 2.57 MIL/uL — ABNORMAL LOW (ref 3.87–5.11)
RDW: 14.6 % (ref 11.5–15.5)
WBC: 8.9 10*3/uL (ref 4.0–10.5)

## 2017-12-11 LAB — BASIC METABOLIC PANEL
Anion gap: 11 (ref 5–15)
BUN: 8 mg/dL (ref 8–23)
CALCIUM: 8.1 mg/dL — AB (ref 8.9–10.3)
CO2: 24 mmol/L (ref 22–32)
CREATININE: 0.56 mg/dL (ref 0.44–1.00)
Chloride: 98 mmol/L (ref 98–111)
GFR calc Af Amer: >60 mL/min (ref >60)
Glucose, Bld: 92 mg/dL (ref 70–99)
Potassium: 3.3 mmol/L — ABNORMAL LOW (ref 3.5–5.1)
Sodium: 133 mmol/L — ABNORMAL LOW (ref 135–145)

## 2017-12-11 MED ORDER — METOCLOPRAMIDE HCL 5 MG/ML IJ SOLN: 5.0000 mg | Freq: Three times a day (TID) | INTRAMUSCULAR | Status: DC | PRN

## 2017-12-11 MED ORDER — GLYCERIN (LAXATIVE) 2.1 G RE SUPP
1.0000 | Freq: Once | RECTAL | Status: AC
  Administered 2017-12-11: 1 via RECTAL
  Filled 2017-12-11: qty 1

## 2017-12-11 MED ORDER — POTASSIUM CHLORIDE CRYS ER 20 MEQ PO TBCR
20.0000 meq | EXTENDED_RELEASE_TABLET | Freq: Two times a day (BID) | ORAL | Status: AC
  Administered 2017-12-11 – 2017-12-12 (×2): 20 meq via ORAL
  Filled 2017-12-11 (×2): qty 1

## 2017-12-11 MED ORDER — ENOXAPARIN SODIUM 40 MG/0.4ML ~~LOC~~ SOLN
40.0000 mg | SUBCUTANEOUS | Status: DC
  Administered 2017-12-12 – 2017-12-17 (×6): 40 mg via SUBCUTANEOUS
  Filled 2017-12-11 (×7): qty 0.4

## 2017-12-11 MED ORDER — METOCLOPRAMIDE HCL 5 MG/ML IJ SOLN
5.0000 mg | Freq: Three times a day (TID) | INTRAMUSCULAR | Status: DC
  Administered 2017-12-11 – 2017-12-16 (×13): 5 mg via INTRAVENOUS
  Filled 2017-12-11 (×13): qty 2
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
## 2017-12-11 NOTE — Plan of Care (Signed)
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
## 2017-12-11 NOTE — Progress Notes (Signed)
Inpatient Rehabilitation-Admissions Coordinator   Spoke with pt and her family (son and daughter) at the bedside. They have come to a decision and would like to pursue CIR with plan to go to son's house if pt requires assistance at DC.   AC will follow for medical readiness and then begin insurance authorization process.   Please call if questions.   Nanine Means, OTR/L  Rehab Admissions Coordinator  [phone] 12/11/2017 4:47 PM
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
## 2017-12-12 ENCOUNTER — Inpatient Hospital Stay (HOSPITAL_COMMUNITY): Payer: Medicare Other

## 2017-12-12 LAB — CBC
HEMATOCRIT: 27 % — AB (ref 36.0–46.0)
HEMOGLOBIN: 8.9 g/dL — AB (ref 12.0–15.0)
MCH: 32.4 pg (ref 26.0–34.0)
MCHC: 33 g/dL (ref 30.0–36.0)
MCV: 98.2 fL (ref 78.0–100.0)
Platelets: 221 10*3/uL (ref 150–400)
RBC: 2.75 MIL/uL — AB (ref 3.87–5.11)
RDW: 14.6 % (ref 11.5–15.5)
WBC: 7.8 10*3/uL (ref 4.0–10.5)

## 2017-12-12 LAB — BASIC METABOLIC PANEL
ANION GAP: 10 (ref 5–15)
ANION GAP: 15 (ref 5–15)
BUN: 9 mg/dL (ref 8–23)
BUN: 10 mg/dL (ref 8–23)
CALCIUM: 8.3 mg/dL — AB (ref 8.9–10.3)
CHLORIDE: 98 mmol/L (ref 98–111)
CO2: 23 mmol/L (ref 22–32)
CO2: 19 mmol/L — ABNORMAL LOW (ref 22–32)
Calcium: 8.6 mg/dL — ABNORMAL LOW (ref 8.9–10.3)
Chloride: 99 mmol/L (ref 98–111)
Creatinine, Ser: 0.57 mg/dL (ref 0.44–1.00)
Creatinine, Ser: 0.63 mg/dL (ref 0.44–1.00)
Glucose, Bld: 82 mg/dL (ref 70–99)
Glucose, Bld: 77 mg/dL (ref 70–99)
POTASSIUM: 3.9 mmol/L (ref 3.5–5.1)
Potassium: 3 mmol/L — ABNORMAL LOW (ref 3.5–5.1)
SODIUM: 132 mmol/L — AB (ref 135–145)
SODIUM: 132 mmol/L — AB (ref 135–145)

## 2017-12-12 LAB — TYPE AND SCREEN
ABO/RH(D): O POS
Antibody Screen: NEGATIVE
UNIT DIVISION: 0
UNIT DIVISION: 0
Unit division: 0

## 2017-12-12 LAB — BPAM RBC
BLOOD PRODUCT EXPIRATION DATE: 201910102359
BLOOD PRODUCT EXPIRATION DATE: 201910112359
Blood Product Expiration Date: 201910112359
ISSUE DATE / TIME: 201909160933
ISSUE DATE / TIME: 201909160933
ISSUE DATE / TIME: 201909221123
UNIT TYPE AND RH: 5100
Unit Type and Rh: 5100
Unit Type and Rh: 5100

## 2017-12-12 LAB — MAGNESIUM: MAGNESIUM: 1.5 mg/dL — AB (ref 1.7–2.4)

## 2017-12-12 MED ORDER — TRAMADOL HCL 50 MG PO TABS
50.0000 mg | ORAL_TABLET | Freq: Four times a day (QID) | ORAL | Status: DC | PRN
  Administered 2017-12-16: 50 mg via ORAL
  Filled 2017-12-12 (×3): qty 1

## 2017-12-12 MED ORDER — KETOROLAC TROMETHAMINE 15 MG/ML IJ SOLN
15.0000 mg | Freq: Four times a day (QID) | INTRAMUSCULAR | Status: AC
  Administered 2017-12-12 – 2017-12-14 (×8): 15 mg via INTRAVENOUS
  Filled 2017-12-12 (×8): qty 1

## 2017-12-12 MED ORDER — POTASSIUM CHLORIDE 10 MEQ/100ML IV SOLN
10.0000 meq | INTRAVENOUS | Status: AC
  Administered 2017-12-12 (×6): 10 meq via INTRAVENOUS
  Filled 2017-12-12 (×6): qty 100

## 2017-12-12 MED ORDER — MORPHINE SULFATE (PF) 2 MG/ML IV SOLN: 2.0000 mg | INTRAVENOUS | Status: DC | PRN

## 2017-12-12 MED ORDER — MAGNESIUM SULFATE 2 GM/50ML IV SOLN
2.0000 g | Freq: Once | INTRAVENOUS | Status: AC
  Administered 2017-12-12: 2 g via INTRAVENOUS
  Filled 2017-12-12: qty 50

## 2017-12-12 MED ORDER — GLYCERIN (LAXATIVE) 2.1 G RE SUPP
1.0000 | Freq: Once | RECTAL | Status: AC
  Administered 2017-12-12: 1 via RECTAL
  Filled 2017-12-12 (×2): qty 1
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
## 2017-12-12 NOTE — Progress Notes (Signed)
Patient ID: [name], female   DOB: 1945/06/06, 72 y.o.   MRN: [id number]   LOS: 6 days   Subjective: Struggling with ileus, tearful. No c/o regarding clavicle or hip.   Objective: Vital signs in last 24 hours: Temp:  [97.8 F (36.6 C)-98.2 F (36.8 C)] 98.2 F (36.8 C) (09/26 0430) Pulse Rate:  [100-113] 105 (09/26 0430) Resp:  [13-14] 13 (09/26 0430) BP: (94-108)/(64-70) 94/65 (09/26 0430) SpO2:  [90 %-98 %] 97 % (09/26 0430) Last BM Date: 12/06/17   Laboratory  CBC Recent Labs    12/11/17 0400 12/12/17 0420  WBC 8.9 7.8  HGB 8.5* 8.9*  HCT 25.2* 27.0*  PLT 153 221   BMET Recent Labs    12/11/17 0400 12/12/17 0420  NA 133* 132*  K 3.3* 3.0*  CL 98 99  CO2 24 23  GLUCOSE 92 82  BUN 8 9  CREATININE 0.56 0.57  CALCIUM 8.1* 8.3*     Physical Exam General appearance: alert and no distress  LUE: Incision C/D/I LLE: Incision C/D/I   Assessment/Plan: Left clav fx s/p ORIF -- WBAT Pelvic fxs s/p ORIF -- TDWB RLE    Freeman Caldron, PA-C Orthopedic Surgery [phone] 12/12/2017
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
## 2017-12-12 NOTE — Plan of Care (Signed)

## 2017-12-12 NOTE — Plan of Care (Signed)
  Problem: Education: Goal: Knowledge of General Education information will improve Description Including pain rating scale, medication(s)/side effects and non-pharmacologic comfort measures Outcome: Progressing   
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
## 2017-12-13 ENCOUNTER — Inpatient Hospital Stay (HOSPITAL_COMMUNITY): Payer: Medicare Other

## 2017-12-13 LAB — BASIC METABOLIC PANEL
ANION GAP: 17 — AB (ref 5–15)
BUN: 14 mg/dL (ref 8–23)
CO2: 16 mmol/L — AB (ref 22–32)
Calcium: 8.6 mg/dL — ABNORMAL LOW (ref 8.9–10.3)
Chloride: 100 mmol/L (ref 98–111)
Creatinine, Ser: 0.74 mg/dL (ref 0.44–1.00)
GFR calc Af Amer: >60 mL/min (ref >60)
GLUCOSE: 65 mg/dL — AB (ref 70–99)
POTASSIUM: 4.8 mmol/L (ref 3.5–5.1)
Sodium: 133 mmol/L — ABNORMAL LOW (ref 135–145)

## 2017-12-13 LAB — MAGNESIUM: Magnesium: 1.9 mg/dL (ref 1.7–2.4)

## 2017-12-13 MED ORDER — DIATRIZOATE MEGLUMINE & SODIUM 66-10 % PO SOLN
90.0000 mL | Freq: Once | ORAL | Status: AC
  Administered 2017-12-13: 90 mL via ORAL
  Filled 2017-12-13: qty 90

## 2017-12-13 MED ORDER — METHOCARBAMOL 500 MG PO TABS
1000.0000 mg | ORAL_TABLET | Freq: Three times a day (TID) | ORAL | Status: DC | PRN
  Administered 2017-12-14 – 2017-12-16 (×2): 1000 mg via ORAL
  Filled 2017-12-13 (×3): qty 2
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
## 2017-12-13 NOTE — Plan of Care (Signed)
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
## 2017-12-13 NOTE — Progress Notes (Signed)
Inpatient Rehabilitation-Admissions Coordinator   [facility] will continue to follow for medical readiness as well as needs for post acute rehab. AC will follow up with pt Monday. If needs remain, AC will begin insurance authorization at that time.   Nanine Means, OTR/L  Rehab Admissions Coordinator  [phone] 12/13/2017 3:57 PM
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
## 2017-12-15 NOTE — Progress Notes (Signed)
Pt perineal area edematous; hard to touch but pt denies any pain or discomfort; warm pack applied and pt sleeping comfortably in be. Will continue to monitor. Dionne Bucy RN
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
## 2017-12-16 MED ORDER — BACITRACIN ZINC 500 UNIT/GM EX OINT
TOPICAL_OINTMENT | Freq: Two times a day (BID) | CUTANEOUS | Status: DC
  Administered 2017-12-16 – 2017-12-17 (×3): via TOPICAL
  Filled 2017-12-16: qty 28.4

## 2017-12-16 MED ORDER — METOCLOPRAMIDE HCL 5 MG PO TABS
5.0000 mg | ORAL_TABLET | Freq: Three times a day (TID) | ORAL | Status: DC
  Administered 2017-12-16 – 2017-12-17 (×5): 5 mg via ORAL
  Filled 2017-12-16 (×5): qty 1
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
## 2017-12-16 NOTE — Progress Notes (Signed)
Inpatient Rehabilitation-Admissions Coordinator   Noted improvement in terms of medical readiness. Prior to initiating insurance authorization for possible CIR admit, [facility] will need updated PT and OT progress notes. Noted PT assigned today; AC has placed request for OT treatment session.   Please call if questions.   Nanine Means, OTR/L  Rehab Admissions Coordinator  [phone] 12/16/2017 12:25 PM
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
## 2017-12-16 NOTE — Progress Notes (Signed)
Inpatient Rehabilitation-Admissions Coordinator   [facility] has initiated insurance authorization for potential CIR admit.   Please call if questions.   Nanine Means, OTR/L  Rehab Admissions Coordinator  [phone] 12/16/2017 3:02 PM

## 2017-12-17 ENCOUNTER — Inpatient Hospital Stay (HOSPITAL_COMMUNITY)
Admission: RE | Admit: 2017-12-17 | Discharge: 2017-12-31 | DRG: 560 | Disposition: A | Discharge status: Home Health | Payer: Medicare Other | Source: Intra-hospital | Attending: Physical Medicine & Rehabilitation | Admitting: Physical Medicine & Rehabilitation

## 2017-12-17 ENCOUNTER — Other Ambulatory Visit: Payer: Self-pay

## 2017-12-17 ENCOUNTER — Inpatient Hospital Stay (HOSPITAL_COMMUNITY): Payer: Medicare Other

## 2017-12-17 ENCOUNTER — Encounter (HOSPITAL_COMMUNITY): Payer: Self-pay

## 2017-12-17 DIAGNOSIS — E876 Hypokalemia: Secondary | ICD-10-CM | POA: Diagnosis present

## 2017-12-17 DIAGNOSIS — I7300 Raynaud's syndrome without gangrene: Secondary | ICD-10-CM | POA: Diagnosis present

## 2017-12-17 DIAGNOSIS — S060X9D Concussion with loss of consciousness of unspecified duration, subsequent encounter: Secondary | ICD-10-CM

## 2017-12-17 DIAGNOSIS — R350 Frequency of micturition: Secondary | ICD-10-CM

## 2017-12-17 DIAGNOSIS — E871 Hypo-osmolality and hyponatremia: Secondary | ICD-10-CM

## 2017-12-17 DIAGNOSIS — S060X9A Concussion with loss of consciousness of unspecified duration, initial encounter: Secondary | ICD-10-CM

## 2017-12-17 DIAGNOSIS — F17210 Nicotine dependence, cigarettes, uncomplicated: Secondary | ICD-10-CM | POA: Diagnosis present

## 2017-12-17 DIAGNOSIS — R0989 Other specified symptoms and signs involving the circulatory and respiratory systems: Secondary | ICD-10-CM

## 2017-12-17 DIAGNOSIS — S42002D Fracture of unspecified part of left clavicle, subsequent encounter for fracture with routine healing: Secondary | ICD-10-CM

## 2017-12-17 DIAGNOSIS — K9189 Other postprocedural complications and disorders of digestive system: Secondary | ICD-10-CM | POA: Diagnosis not present

## 2017-12-17 DIAGNOSIS — S060XAA Concussion with loss of consciousness status unknown, initial encounter: Secondary | ICD-10-CM

## 2017-12-17 DIAGNOSIS — S42022A Displaced fracture of shaft of left clavicle, initial encounter for closed fracture: Secondary | ICD-10-CM

## 2017-12-17 DIAGNOSIS — S32811D Multiple fractures of pelvis with unstable disruption of pelvic ring, subsequent encounter for fracture with routine healing: Secondary | ICD-10-CM | POA: Diagnosis present

## 2017-12-17 DIAGNOSIS — S32810G Multiple fractures of pelvis with stable disruption of pelvic ring, subsequent encounter for fracture with delayed healing: Secondary | ICD-10-CM

## 2017-12-17 DIAGNOSIS — S329XXA Fracture of unspecified parts of lumbosacral spine and pelvis, initial encounter for closed fracture: Secondary | ICD-10-CM

## 2017-12-17 DIAGNOSIS — T07XXXA Unspecified multiple injuries, initial encounter: Secondary | ICD-10-CM | POA: Diagnosis not present

## 2017-12-17 DIAGNOSIS — M810 Age-related osteoporosis without current pathological fracture: Secondary | ICD-10-CM | POA: Diagnosis present

## 2017-12-17 DIAGNOSIS — S42022S Displaced fracture of shaft of left clavicle, sequela: Secondary | ICD-10-CM | POA: Diagnosis not present

## 2017-12-17 DIAGNOSIS — Z09 Encounter for follow-up examination after completed treatment for conditions other than malignant neoplasm: Secondary | ICD-10-CM

## 2017-12-17 DIAGNOSIS — K567 Ileus, unspecified: Secondary | ICD-10-CM | POA: Diagnosis present

## 2017-12-17 DIAGNOSIS — D62 Acute posthemorrhagic anemia: Secondary | ICD-10-CM | POA: Diagnosis present

## 2017-12-17 DIAGNOSIS — Z7983 Long term (current) use of bisphosphonates: Secondary | ICD-10-CM

## 2017-12-17 DIAGNOSIS — S32810S Multiple fractures of pelvis with stable disruption of pelvic ring, sequela: Secondary | ICD-10-CM | POA: Diagnosis not present

## 2017-12-17 LAB — BASIC METABOLIC PANEL
ANION GAP: 7 (ref 5–15)
BUN: 6 mg/dL — AB (ref 8–23)
CHLORIDE: 103 mmol/L (ref 98–111)
CO2: 26 mmol/L (ref 22–32)
Calcium: 8.6 mg/dL — ABNORMAL LOW (ref 8.9–10.3)
Creatinine, Ser: 0.52 mg/dL (ref 0.44–1.00)
GFR calc Af Amer: >60 mL/min (ref >60)
GLUCOSE: 99 mg/dL (ref 70–99)
Potassium: 3.9 mmol/L (ref 3.5–5.1)
SODIUM: 136 mmol/L (ref 135–145)

## 2017-12-17 MED ORDER — POLYETHYLENE GLYCOL 3350 17 G PO PACK
17.0000 g | PACK | Freq: Every day | ORAL | Status: DC | PRN
  Administered 2017-12-19: 17 g via ORAL

## 2017-12-17 MED ORDER — ALUM & MAG HYDROXIDE-SIMETH 200-200-20 MG/5ML PO SUSP: 30.0000 mL | ORAL | Status: DC | PRN

## 2017-12-17 MED ORDER — BISACODYL 10 MG RE SUPP: 10.0000 mg | Freq: Every day | RECTAL | Status: DC | PRN

## 2017-12-17 MED ORDER — FLEET ENEMA 7-19 GM/118ML RE ENEM: 1.0000 | ENEMA | Freq: Once | RECTAL | Status: DC | PRN

## 2017-12-17 MED ORDER — DIPHENHYDRAMINE HCL 12.5 MG/5ML PO ELIX: 12.5000 mg | ORAL_SOLUTION | Freq: Four times a day (QID) | ORAL | Status: DC | PRN

## 2017-12-17 MED ORDER — ACETAMINOPHEN 325 MG PO TABS
325.0000 mg | ORAL_TABLET | ORAL | Status: DC | PRN
  Administered 2017-12-31: 325 mg via ORAL
  Filled 2017-12-17 (×2): qty 2

## 2017-12-17 MED ORDER — POLYETHYLENE GLYCOL 3350 17 G PO PACK
17.0000 g | PACK | Freq: Every day | ORAL | Status: DC
  Administered 2017-12-18 – 2017-12-31 (×9): 17 g via ORAL
  Filled 2017-12-17 (×13): qty 1

## 2017-12-17 MED ORDER — TRAMADOL HCL 50 MG PO TABS
50.0000 mg | ORAL_TABLET | Freq: Four times a day (QID) | ORAL | Status: DC | PRN
  Administered 2017-12-19 – 2017-12-29 (×16): 50 mg via ORAL
  Filled 2017-12-17 (×20): qty 1

## 2017-12-17 MED ORDER — POTASSIUM CHLORIDE CRYS ER 20 MEQ PO TBCR
20.0000 meq | EXTENDED_RELEASE_TABLET | Freq: Two times a day (BID) | ORAL | Status: DC
  Administered 2017-12-17: 20 meq via ORAL
  Filled 2017-12-17 (×2): qty 1

## 2017-12-17 MED ORDER — GABAPENTIN 600 MG PO TABS
300.0000 mg | ORAL_TABLET | Freq: Three times a day (TID) | ORAL | Status: DC
  Administered 2017-12-17: 300 mg via ORAL
  Filled 2017-12-17 (×2): qty 1

## 2017-12-17 MED ORDER — ENOXAPARIN SODIUM 40 MG/0.4ML ~~LOC~~ SOLN
40.0000 mg | SUBCUTANEOUS | Status: DC
  Administered 2017-12-18 – 2017-12-30 (×13): 40 mg via SUBCUTANEOUS
  Filled 2017-12-17 (×14): qty 0.4

## 2017-12-17 MED ORDER — BISACODYL 10 MG RE SUPP
10.0000 mg | Freq: Every day | RECTAL | Status: DC
  Administered 2017-12-19 – 2017-12-25 (×7): 10 mg via RECTAL
  Filled 2017-12-17 (×7): qty 1

## 2017-12-17 MED ORDER — METHOCARBAMOL 500 MG PO TABS
1000.0000 mg | ORAL_TABLET | Freq: Three times a day (TID) | ORAL | Status: DC | PRN
  Administered 2017-12-19 – 2017-12-26 (×8): 1000 mg via ORAL
  Filled 2017-12-17 (×8): qty 2

## 2017-12-17 MED ORDER — DOCUSATE SODIUM 100 MG PO CAPS
100.0000 mg | ORAL_CAPSULE | Freq: Two times a day (BID) | ORAL | Status: DC
  Administered 2017-12-17 – 2017-12-31 (×25): 100 mg via ORAL
  Filled 2017-12-17 (×25): qty 1

## 2017-12-17 MED ORDER — METOCLOPRAMIDE HCL 5 MG PO TABS
5.0000 mg | ORAL_TABLET | Freq: Three times a day (TID) | ORAL | Status: DC
  Administered 2017-12-18 – 2017-12-20 (×7): 5 mg via ORAL
  Filled 2017-12-17 (×7): qty 1

## 2017-12-17 MED ORDER — GUAIFENESIN-DM 100-10 MG/5ML PO SYRP: 5.0000 mL | ORAL_SOLUTION | Freq: Four times a day (QID) | ORAL | Status: DC | PRN

## 2017-12-17 MED ORDER — ENSURE ENLIVE PO LIQD
237.0000 mL | Freq: Two times a day (BID) | ORAL | Status: DC
  Administered 2017-12-18 – 2017-12-31 (×19): 237 mL via ORAL
  Filled 2017-12-17: qty 237

## 2017-12-17 MED ORDER — BACITRACIN ZINC 500 UNIT/GM EX OINT
TOPICAL_OINTMENT | Freq: Two times a day (BID) | CUTANEOUS | Status: DC
  Administered 2017-12-17: 1 via TOPICAL
  Administered 2017-12-18 – 2017-12-22 (×9): via TOPICAL
  Administered 2017-12-22: 1 via TOPICAL
  Administered 2017-12-23 – 2017-12-28 (×6): via TOPICAL
  Filled 2017-12-17 (×7): qty 28.35

## 2017-12-17 MED ORDER — TRAZODONE HCL 50 MG PO TABS
25.0000 mg | ORAL_TABLET | Freq: Every evening | ORAL | Status: DC | PRN
  Administered 2017-12-20 – 2017-12-27 (×3): 50 mg via ORAL
  Administered 2017-12-30: 25 mg via ORAL
  Filled 2017-12-17 (×5): qty 1

## 2017-12-17 MED ORDER — CALCIUM CARBONATE-VITAMIN D 500-200 MG-UNIT PO TABS
1.0000 | ORAL_TABLET | Freq: Every day | ORAL | Status: DC
  Administered 2017-12-18 – 2017-12-31 (×14): 1 via ORAL
  Filled 2017-12-17 (×14): qty 1

## 2017-12-17 MED ORDER — ACETAMINOPHEN 500 MG PO TABS
1000.0000 mg | ORAL_TABLET | Freq: Three times a day (TID) | ORAL | Status: DC
  Administered 2017-12-17 – 2017-12-30 (×38): 1000 mg via ORAL
  Filled 2017-12-17 (×38): qty 2

## 2017-12-17 MED ORDER — PROCHLORPERAZINE MALEATE 5 MG PO TABS: 5.0000 mg | ORAL_TABLET | Freq: Four times a day (QID) | ORAL | Status: DC | PRN

## 2017-12-17 MED ORDER — PROCHLORPERAZINE 25 MG RE SUPP: 12.5000 mg | Freq: Four times a day (QID) | RECTAL | Status: DC | PRN

## 2017-12-17 MED ORDER — PROCHLORPERAZINE EDISYLATE 10 MG/2ML IJ SOLN: 5.0000 mg | Freq: Four times a day (QID) | INTRAMUSCULAR | Status: DC | PRN
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
## 2017-12-17 NOTE — Progress Notes (Signed)
Inpatient Rehabilitation-Admissions Coordinator   [facility] received insurance authorization and medical approval for admit to CIR today. AC has updated pt, family, RN, and CM regarding plans. Please call if questions.   Nanine Means, OTR/L  Rehab Admissions Coordinator  [phone] 12/17/2017 3:06 PM
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
## 2017-12-18 ENCOUNTER — Inpatient Hospital Stay (HOSPITAL_COMMUNITY): Payer: Medicare Other | Admitting: Physical Therapy

## 2017-12-18 ENCOUNTER — Inpatient Hospital Stay (HOSPITAL_COMMUNITY): Payer: Medicare Other | Admitting: Speech Pathology

## 2017-12-18 ENCOUNTER — Inpatient Hospital Stay (HOSPITAL_COMMUNITY): Payer: Medicare Other

## 2017-12-18 DIAGNOSIS — K567 Ileus, unspecified: Secondary | ICD-10-CM

## 2017-12-18 DIAGNOSIS — D62 Acute posthemorrhagic anemia: Secondary | ICD-10-CM

## 2017-12-18 DIAGNOSIS — S42022A Displaced fracture of shaft of left clavicle, initial encounter for closed fracture: Secondary | ICD-10-CM

## 2017-12-18 DIAGNOSIS — K9189 Other postprocedural complications and disorders of digestive system: Secondary | ICD-10-CM

## 2017-12-18 LAB — CBC WITH DIFFERENTIAL/PLATELET
ABS IMMATURE GRANULOCYTES: 0.1 10*3/uL (ref 0.0–0.1)
Basophils Absolute: 0 10*3/uL (ref 0.0–0.1)
Basophils Relative: 0 %
Eosinophils Absolute: 0.1 10*3/uL (ref 0.0–0.7)
Eosinophils Relative: 1 %
HEMATOCRIT: 26.4 % — AB (ref 36.0–46.0)
HEMOGLOBIN: 8.7 g/dL — AB (ref 12.0–15.0)
IMMATURE GRANULOCYTES: 1 %
LYMPHS ABS: 1.3 10*3/uL (ref 0.7–4.0)
LYMPHS PCT: 13 %
MCH: 32.7 pg (ref 26.0–34.0)
MCHC: 33 g/dL (ref 30.0–36.0)
MCV: 99.2 fL (ref 78.0–100.0)
Monocytes Absolute: 0.9 10*3/uL (ref 0.1–1.0)
Monocytes Relative: 9 %
NEUTROS ABS: 7.7 10*3/uL (ref 1.7–7.7)
NEUTROS PCT: 76 %
Platelets: 638 10*3/uL — ABNORMAL HIGH (ref 150–400)
RBC: 2.66 MIL/uL — ABNORMAL LOW (ref 3.87–5.11)
RDW: 16.1 % — ABNORMAL HIGH (ref 11.5–15.5)
WBC: 10.1 10*3/uL (ref 4.0–10.5)

## 2017-12-18 LAB — COMPREHENSIVE METABOLIC PANEL
ALBUMIN: 2.6 g/dL — AB (ref 3.5–5.0)
ALT: 22 U/L (ref 0–44)
AST: 30 U/L (ref 15–41)
Alkaline Phosphatase: 164 U/L — ABNORMAL HIGH (ref 38–126)
Anion gap: 10 (ref 5–15)
BILIRUBIN TOTAL: 0.7 mg/dL (ref 0.3–1.2)
BUN: 8 mg/dL (ref 8–23)
CO2: 25 mmol/L (ref 22–32)
Calcium: 8.5 mg/dL — ABNORMAL LOW (ref 8.9–10.3)
Chloride: 99 mmol/L (ref 98–111)
Creatinine, Ser: 0.48 mg/dL (ref 0.44–1.00)
GFR calc Af Amer: >60 mL/min (ref >60)
GFR calc non Af Amer: >60 mL/min (ref >60)
GLUCOSE: 103 mg/dL — AB (ref 70–99)
Potassium: 3.7 mmol/L (ref 3.5–5.1)
Sodium: 134 mmol/L — ABNORMAL LOW (ref 135–145)
Total Protein: 5 g/dL — ABNORMAL LOW (ref 6.5–8.1)

## 2017-12-18 LAB — MAGNESIUM: Magnesium: 1.5 mg/dL — ABNORMAL LOW (ref 1.7–2.4)

## 2017-12-18 MED ORDER — POTASSIUM CHLORIDE CRYS ER 20 MEQ PO TBCR
20.0000 meq | EXTENDED_RELEASE_TABLET | Freq: Once | ORAL | Status: AC
  Administered 2017-12-18: 20 meq via ORAL

## 2017-12-18 MED ORDER — POTASSIUM CHLORIDE CRYS ER 20 MEQ PO TBCR
40.0000 meq | EXTENDED_RELEASE_TABLET | Freq: Two times a day (BID) | ORAL | Status: DC
  Administered 2017-12-18 – 2017-12-26 (×16): 40 meq via ORAL
  Filled 2017-12-18 (×17): qty 2

## 2017-12-18 MED ORDER — MAGNESIUM SULFATE 4 GM/100ML IV SOLN
4.0000 g | Freq: Once | INTRAVENOUS | Status: AC
  Administered 2017-12-18: 4 g via INTRAVENOUS
  Filled 2017-12-18: qty 100

## 2017-12-18 MED ORDER — LIDOCAINE 5 % EX PTCH
2.0000 | MEDICATED_PATCH | CUTANEOUS | Status: DC
  Administered 2017-12-18 – 2017-12-31 (×13): 2 via TRANSDERMAL
  Filled 2017-12-18 (×16): qty 2

## 2017-12-18 MED ORDER — MAGNESIUM GLUCONATE 500 MG PO TABS
500.0000 mg | ORAL_TABLET | Freq: Every day | ORAL | Status: DC
  Administered 2017-12-18 – 2017-12-31 (×14): 500 mg via ORAL
  Filled 2017-12-18 (×14): qty 1
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
## 2017-12-18 NOTE — Progress Notes (Signed)
Patient information reviewed and entered into eRehab system by Malala Trenkamp, RN, CRRN, PPS Coordinator.  Information including medical coding, functional ability and quality indicators will be reviewed and updated through discharge.     Per nursing patient was given "Data Collection Information Summary for Patients in Inpatient Rehabilitation Facilities with attached "Privacy Act Statement-Health Care Records" upon admission.  
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
## 2017-12-19 ENCOUNTER — Inpatient Hospital Stay (HOSPITAL_COMMUNITY): Payer: Medicare Other

## 2017-12-19 ENCOUNTER — Inpatient Hospital Stay (HOSPITAL_COMMUNITY): Payer: Medicare Other | Admitting: Speech Pathology

## 2017-12-19 ENCOUNTER — Inpatient Hospital Stay (HOSPITAL_COMMUNITY): Payer: Medicare Other | Admitting: Physical Therapy

## 2017-12-19 DIAGNOSIS — E876 Hypokalemia: Secondary | ICD-10-CM

## 2017-12-19 DIAGNOSIS — S42022S Displaced fracture of shaft of left clavicle, sequela: Secondary | ICD-10-CM

## 2017-12-19 DIAGNOSIS — S32810S Multiple fractures of pelvis with stable disruption of pelvic ring, sequela: Secondary | ICD-10-CM

## 2017-12-19 LAB — BASIC METABOLIC PANEL
ANION GAP: 9 (ref 5–15)
BUN: 7 mg/dL — ABNORMAL LOW (ref 8–23)
CALCIUM: 8.3 mg/dL — AB (ref 8.9–10.3)
CHLORIDE: 101 mmol/L (ref 98–111)
CO2: 26 mmol/L (ref 22–32)
CREATININE: 0.54 mg/dL (ref 0.44–1.00)
GFR calc non Af Amer: >60 mL/min (ref >60)
Glucose, Bld: 107 mg/dL — ABNORMAL HIGH (ref 70–99)
Potassium: 4.1 mmol/L (ref 3.5–5.1)
SODIUM: 136 mmol/L (ref 135–145)

## 2017-12-19 LAB — MAGNESIUM: MAGNESIUM: 2.2 mg/dL (ref 1.7–2.4)
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
## 2017-12-19 NOTE — Progress Notes (Signed)
Occupational Therapy Session Note  Patient Details  Name: [name] MRN: [id number] Date of Birth: 06/12/1945  Today's Date: 12/19/2017 OT Individual Time: 0830-0920 Session 2: 1300-1320 OT Individual Time Calculation (min): 50 min  Session 2: 20 min   Short Term Goals: Week 1:  OT Short Term Goal 1 (Week 1): Pt will don pants with min A OT Short Term Goal 2 (Week 1): Pt will complete toilet transfer with CGA OT Short Term Goal 3 (Week 1): Pt will transfer into walk in shower with CGA OT Short Term Goal 4 (Week 1): Pt will complete sit to stand with CGA  Skilled Therapeutic Interventions/Progress Updates:    Session focused on ADL transfers, adherence to LLE precautions and functional activity tolerance during b/d tasks. Pt reported no pain at rest, but 7/10 pain in L hip/leg during transitional movement. Pt provided with ample rest breaks for pain management. Pt required min A to power up from EOB and stand pivot to [facility]. Min A provided during static standing to perform peri hygiene. Pt completed LB dressing with vc for dressing affected leg first, pt requiring mod A overall. Pt sat in w/c to eat breakfast quickly, requiring no cueing/assist. 10 min missed d/t pt eating breakfast. Pt was brought down to IADL suite where she was given demo and edu re TTB options and importance of adherence to LLE TDWB precautions. Discussion re d/c planning. Pt returned to room and was left sitting up in w/c with all needs met.   Session 2: Session focused on care coordination with nursing staff and tub transfers. Pt c/o significant dizziness upon OT entry to room, RN alerted and discussed progress with therapy thus far and vitals obtained during mobility/ADLs. RN approved pt to continue with therapy. Pt shared update on tub/shower at daughters house as requested, and pt simulated walk in shower transfer. Further recommendations provided re shower chair with pt. Pt returned to room and was left sitting up with all  needs met. 10 minutes missed d/t pt visiting with friend.   Therapy Documentation Precautions:  Precautions Precautions: Fall Precaution Comments: Clavicle fx, sternal fx, pelvic fx Restrictions Weight Bearing Restrictions: Yes LUE Weight Bearing: Weight bearing as tolerated RLE Weight Bearing: Weight bearing as tolerated LLE Weight Bearing: Touchdown weight bearing    Pain: Pain Assessment Pain Scale: 0-10 Pain Score: 7  Pain Type: Acute pain Pain Location: Leg Pain Orientation: Left Pain Descriptors / Indicators: Aching Pain Onset: With Activity Pain Intervention(s): Emotional support;Repositioned   Therapy/Group: Individual Therapy  Curtis Sites 12/19/2017, 9:48 AM
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
## 2017-12-19 NOTE — Care Management (Signed)
[facility]patient [facility] Individual Statement of Services  Patient Name:  [name]  Date:  12/19/2017  Welcome to the Inpatient Rehabilitation Center.  Our goal is to provide you with an individualized program based on your diagnosis and situation, designed to meet your specific needs.  With this comprehensive rehabilitation program, you will be expected to participate in at least 3 hours of rehabilitation therapies Monday-Friday, with modified therapy programming on the weekends.  Your rehabilitation program will include the following services:  Physical Therapy (PT), Occupational Therapy (OT), Speech Therapy (ST), 24 hour per day rehabilitation nursing, Therapeutic Recreaction (TR), Neuropsychology, Case Management (Social Worker), Rehabilitation Medicine, Nutrition Services and Pharmacy Services  Weekly team conferences will be held on Tuesdays to discuss your progress.  Your Social Worker will talk with you frequently to get your input and to update you on team discussions.  Team conferences with you and your family in attendance may also be held.  Expected length of stay: 14 days   Overall anticipated outcome: supervision  Depending on your progress and recovery, your program may change. Your Social Worker will coordinate services and will keep you informed of any changes. Your Social Worker's name and contact numbers are listed  below.  The following services may also be recommended but are not provided by the Inpatient Rehabilitation Center:   Driving Evaluations  Home Health Rehabiltiation Services  Outpatient Rehabilitation Services   Arrangements will be made to provide these services after discharge if needed.  Arrangements include referral to agencies that provide these services.  Your insurance has been verified to be:  [employer] Your primary doctor is:  Sigmund Hazel  Pertinent information will be shared with your doctor and your insurance company.  Social  Worker:  [place], [place] [phone] or [phone]   Information discussed with and copy given to patient by: Amada Jupiter, 12/19/2017, 1:55 PM

## 2017-12-19 NOTE — Progress Notes (Signed)
Physical Therapy Session Note  Patient Details  Name: [name] MRN: [id number] Date of Birth: Oct 21, 1945  Today's Date: 12/19/2017 PT Individual Time: 4098-1191 PT Individual Time Calculation (min): 61 min   Short Term Goals: Week 1:  PT Short Term Goal 1 (Week 1): Pt will complete transfers with min A consistently PT Short Term Goal 2 (Week 1): Pt will ambulate x 150 ft with min A x 1 with RW and good adherence to TDWB on LLE PT Short Term Goal 3 (Week 1): Pt will initiate stair training as safe and able  Skilled Therapeutic Interventions/Progress Updates:    pt performs gait 50' x 3 throughout session with cues for TDWB on Lt LE, CGA.  Standing balance with horseshoe toss with pt able to stand 3-4 minutes with 1 UE support before requiring seated rest.  therex for LT LE strengthening and AAROM in seated and supine with pt with improving tolerance to therex throughout session.   Bed mobility supine <> Sit blocked practice with pt requiring min A for Lt LE into and out of bed.  Pt left in bed with needs at hand, heat applied, alarm set.  Therapy Documentation Precautions:  Precautions Precautions: Fall Precaution Comments: Clavicle fx, sternal fx, pelvic fx Restrictions Weight Bearing Restrictions: Yes LUE Weight Bearing: Weight bearing as tolerated RLE Weight Bearing: Weight bearing as tolerated LLE Weight Bearing: Touchdown weight bearing  Pain: Pt c/o pain in Lt hip throughout session, repositioned as needed, heat applied after session.   Therapy/Group: Individual Therapy  Sammye Staff 12/19/2017, 10:43 AM
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
## 2017-12-19 NOTE — Progress Notes (Signed)
Speech Language Pathology Daily Session Note  Patient Details  Name: [name] MRN: [id number] Date of Birth: [birth year]  Today's Date: 12/19/2017 SLP Individual Time: 1400-1430 SLP Individual Time Calculation (min): 30 min  Short Term Goals: Week 1: SLP Short Term Goal 1 (Week 1): Patient will demonstrate functional problem solving for complex tasks with Mod I.  SLP Short Term Goal 2 (Week 1): Patient will recall new, daily information with Mod I.  SLP Short Term Goal 3 (Week 1): Patient will utilize word-finding strategies at the conversation level with Mod I.   Skilled Therapeutic Interventions: Skilled treatment session focused on cognitive goals. SLP facilitated session by providing overall Min A verbal cues for complex problem solving during a money management task that is familiar to the patient. Patient requested to get back to bed at end of session and required supervision-Min A verbal cues for utilization of weightbearing precautions. Patient left upright in bed with alarm on and all needs within reach. Continue with current plan of care.         Pain No/Denies Pain   Therapy/Group: Individual Therapy  Keryl Gholson 12/19/2017, 3:09 PM

## 2017-12-20 ENCOUNTER — Inpatient Hospital Stay (HOSPITAL_COMMUNITY): Payer: Medicare Other

## 2017-12-20 ENCOUNTER — Inpatient Hospital Stay (HOSPITAL_COMMUNITY): Payer: Medicare Other | Admitting: Physical Therapy

## 2017-12-20 ENCOUNTER — Inpatient Hospital Stay (HOSPITAL_COMMUNITY): Payer: Medicare Other | Admitting: Speech Pathology

## 2017-12-20 LAB — BASIC METABOLIC PANEL
ANION GAP: 11 (ref 5–15)
BUN: 8 mg/dL (ref 8–23)
CHLORIDE: 99 mmol/L (ref 98–111)
CO2: 24 mmol/L (ref 22–32)
Calcium: 8.5 mg/dL — ABNORMAL LOW (ref 8.9–10.3)
Creatinine, Ser: 0.6 mg/dL (ref 0.44–1.00)
GFR calc non Af Amer: >60 mL/min (ref >60)
Glucose, Bld: 108 mg/dL — ABNORMAL HIGH (ref 70–99)
POTASSIUM: 4.2 mmol/L (ref 3.5–5.1)
Sodium: 134 mmol/L — ABNORMAL LOW (ref 135–145)

## 2017-12-20 LAB — MAGNESIUM: MAGNESIUM: 2 mg/dL (ref 1.7–2.4)

## 2017-12-20 MED ORDER — METOCLOPRAMIDE HCL 5 MG PO TABS
10.0000 mg | ORAL_TABLET | Freq: Three times a day (TID) | ORAL | Status: DC
  Administered 2017-12-20 – 2017-12-23 (×9): 10 mg via ORAL
  Filled 2017-12-20 (×10): qty 2

## 2017-12-20 NOTE — Progress Notes (Signed)
Physical Therapy Session Note  Patient Details  Name: [name] MRN: [id number] Date of Birth: 08-29-1945  Today's Date: 12/20/2017 PT Individual Time: 1100-1140 PT Individual Time Calculation (min): 40 min   Short Term Goals: Week 1:  PT Short Term Goal 1 (Week 1): Pt will complete transfers with min A consistently PT Short Term Goal 2 (Week 1): Pt will ambulate x 150 ft with min A x 1 with RW and good adherence to TDWB on LLE PT Short Term Goal 3 (Week 1): Pt will initiate stair training as safe and able  Skilled Therapeutic Interventions/Progress Updates:    pt performs sit <> stand throughout session with min/mod A, limited by pain.  Gait 30' x 2 with RW and min A, cues for TDWB.  Attempt stair negotiation, pt unable due to pain.  Pt transferred squat pivot transfer with mod A to w/c and then to bed.  Ice applied to low back for pain relief.  Pt given handout of supine therex to perform when pain is more controlled.  RN aware of increased pain.  Therapy Documentation Precautions:  Precautions Precautions: Fall Precaution Comments: Clavicle fx, sternal fx, pelvic fx Restrictions Weight Bearing Restrictions: Yes LUE Weight Bearing: Weight bearing as tolerated RLE Weight Bearing: Touchdown weight bearing LLE Weight Bearing: Weight bearing as tolerated General: PT Amount of Missed Time (min): 20 Minutes PT Missed Treatment Reason: Pain Pain: Pt reports pain 9/10 in back, difficulty participating in therapy requiring mod A for transfers due to pain. RN made aware, ice applied after session.  Pt missed 20 minutes skilled PT   Therapy/Group: Individual Therapy  Etheridge Geil 12/20/2017, 12:17 PM

## 2017-12-20 NOTE — Progress Notes (Signed)
No c/o of n/v, will continue to monitor.

## 2017-12-20 NOTE — Progress Notes (Signed)
Physical Therapy Session Note  Patient Details  Name: [name] MRN: [id number] Date of Birth: [birth year]  Today's Date: 12/20/2017 PT Individual [id number] PT Individual Time Calculation (min): 45 min   Short Term Goals: Week 1:  PT Short Term Goal 1 (Week 1): Pt will complete transfers with min A consistently PT Short Term Goal 2 (Week 1): Pt will ambulate x 150 ft with min A x 1 with RW and good adherence to TDWB on LLE PT Short Term Goal 3 (Week 1): Pt will initiate stair training as safe and able  Skilled Therapeutic Interventions/Progress Updates:   Pt received supine in bed and agreeable to PT. Supine>sit transfer with min assist and mod cues for safety.    Stand pivot transfer with mod assist to WC. Pt reports need for bowel movement. Toilet transfers with mod assist and UE support on RW. Moderate cues for proper UE placement and decreased WB through the LLE. PT required to perform pericare and clothing management with pt standing  At toilet following small BM. Pt transported to rehab gym in RW. Sit<>stand from [facility] with max assist from PT progressing to mod assist due to poor use of arm rests initially. Dizziness reported in standing. Vitals asessed. Sitting: 151/68 standing: 138/78 only small change in dizziness in standing.   Seated therex: LAQ, hip flexion, AROM hip abduction, ankle PF all completed x 10 BLE with cues for AROM within pain free range.   WC mobility instructed by PT x 163f with supervision assist min cues for turning technique from PT.   [facility] returned to [facility] and [facility] in reach and all needs met.        Therapy Documentation Precautions:  Precautions Precautions: Fall Precaution Comments: Clavicle fx, sternal fx, pelvic fx Restrictions Weight Bearing Restrictions: Yes LUE Weight Bearing: Weight bearing as tolerated RLE Weight Bearing: Touchdown weight bearing LLE Weight Bearing: Weight bearing as tolerated  Vital  Signs: see above Pain: Pain Assessment Pain Scale: 0-10 Pain Score: 7  Pain Type: Acute pain Pain Location: Back Pain Orientation: Lower Pain Descriptors / Indicators: Aching Pain Frequency: Constant Pain Onset: On-going Pain Intervention(s): Medication (See eMAR);Repositioned;Cold applied    Therapy/Group: Individual Therapy  ALorie Phenix10/06/2017, 3:11 PM

## 2017-12-20 NOTE — Progress Notes (Signed)
Occupational Therapy Session Note  Patient Details  Name: [name] MRN: [id number] Date of Birth: 09/30/1945  Today's Date: 12/20/2017 OT Individual Time: 1388-7195 OT Individual Time Calculation (min): 54 min    Short Term Goals: Week 1:  OT Short Term Goal 1 (Week 1): Pt will don pants with min A OT Short Term Goal 2 (Week 1): Pt will complete toilet transfer with CGA OT Short Term Goal 3 (Week 1): Pt will transfer into walk in shower with CGA OT Short Term Goal 4 (Week 1): Pt will complete sit to stand with CGA  Skilled Therapeutic Interventions/Progress Updates:    Session focused on b/d tasks with introduction of AE/AD, as well as functional mobility during IADLs. Pt received supine with no c/o pain. Pt transitioned to EOB with min A for lifting A and LLE management. Pt reporting pain during all mobility as 7/10 in her L hip/back. Pain alleviated with rest breaks. Pt completed stand pivot to w/c with mod HHA. Pt completed UB bathing/dressing with set up. Mod A required to don pants, with assistance required to thread LLE and pull up in standing. A reacher and sock aid was obtained with demo given to pt. Pt performed simulated LB dressing with reacher and donned B socks with sock aid with min A. Pt was transported to kitchen where she stood and took 3 hops with RW, vc for LLE TDWB, and stood at counter, reaching bimanually into eye level cabinets. Pt returned to room and was left sitting up with all needs met.   Therapy Documentation Precautions:  Precautions Precautions: Fall Precaution Comments: Clavicle fx, sternal fx, pelvic fx Restrictions Weight Bearing Restrictions: Yes LUE Weight Bearing: Weight bearing as tolerated RLE Weight Bearing: Touchdown weight bearing LLE Weight Bearing: Weight bearing as tolerated  Pain: Pain Assessment Pain Scale: 0-10 Pain Score: 5  Pain Type: Acute pain Pain Location: Back Pain Orientation: Lower Pain Descriptors / Indicators:  Aching Pain Onset: On-going Pain Intervention(s): Rest;Distraction   Therapy/Group: Individual Therapy  Curtis Sites 12/20/2017, 11:26 AM
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
## 2017-12-20 NOTE — Progress Notes (Signed)
Speech Language Pathology Daily Session Note  Patient Details  Name: [name] MRN: [id number] Date of Birth: 1945/06/02  Today's Date: 12/20/2017 SLP Individual Time: 0900-0925 SLP Individual Time Calculation (min): 25 min  Short Term Goals: Week 1: SLP Short Term Goal 1 (Week 1): Patient will demonstrate functional problem solving for complex tasks with Mod I.  SLP Short Term Goal 2 (Week 1): Patient will recall new, daily information with Mod I.  SLP Short Term Goal 3 (Week 1): Patient will utilize word-finding strategies at the conversation level with Mod I.   Skilled Therapeutic Interventions: Skilled treatment session focused on cognitive-linguistic goals. SLP facilitated session by providing supervision verbal cues and extra time for completion of higher-level wording finding tasks at the conversation level. Patient also recalled functional information with overall supervision verbal cues in regards to previous therapy sessions and current plan of care. Patient left upright in bed with alarm on and all needs within reach. Continue with current plan of care.         Pain No/Denies Pain   Therapy/Group: Individual Therapy  [name] 12/20/2017, 12:51 PM
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
## 2017-12-21 DIAGNOSIS — T07XXXA Unspecified multiple injuries, initial encounter: Secondary | ICD-10-CM

## 2017-12-21 DIAGNOSIS — K567 Ileus, unspecified: Secondary | ICD-10-CM
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
## 2017-12-22 ENCOUNTER — Inpatient Hospital Stay (HOSPITAL_COMMUNITY): Payer: Medicare Other | Admitting: Occupational Therapy

## 2017-12-22 ENCOUNTER — Inpatient Hospital Stay (HOSPITAL_COMMUNITY): Payer: Medicare Other

## 2017-12-22 DIAGNOSIS — R0989 Other specified symptoms and signs involving the circulatory and respiratory systems: Secondary | ICD-10-CM
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
## 2017-12-23 ENCOUNTER — Inpatient Hospital Stay (HOSPITAL_COMMUNITY): Payer: Medicare Other

## 2017-12-23 ENCOUNTER — Inpatient Hospital Stay (HOSPITAL_COMMUNITY): Payer: Medicare Other | Admitting: Physical Therapy

## 2017-12-23 LAB — BASIC METABOLIC PANEL
Anion gap: 8 (ref 5–15)
BUN: 10 mg/dL (ref 8–23)
CALCIUM: 8.6 mg/dL — AB (ref 8.9–10.3)
CO2: 23 mmol/L (ref 22–32)
Chloride: 98 mmol/L (ref 98–111)
Creatinine, Ser: 0.55 mg/dL (ref 0.44–1.00)
GFR calc Af Amer: >60 mL/min (ref >60)
Glucose, Bld: 110 mg/dL — ABNORMAL HIGH (ref 70–99)
POTASSIUM: 3.9 mmol/L (ref 3.5–5.1)
SODIUM: 129 mmol/L — AB (ref 135–145)

## 2017-12-23 LAB — CBC
HEMATOCRIT: 31.9 % — AB (ref 36.0–46.0)
Hemoglobin: 10 g/dL — ABNORMAL LOW (ref 12.0–15.0)
MCH: 31.9 pg (ref 26.0–34.0)
MCHC: 31.3 g/dL (ref 30.0–36.0)
MCV: 101.9 fL — ABNORMAL HIGH (ref 78.0–100.0)
PLATELETS: 587 10*3/uL — AB (ref 150–400)
RBC: 3.13 MIL/uL — ABNORMAL LOW (ref 3.87–5.11)
RDW: 16.9 % — AB (ref 11.5–15.5)
WBC: 7.8 10*3/uL (ref 4.0–10.5)

## 2017-12-23 LAB — MAGNESIUM: MAGNESIUM: 2 mg/dL (ref 1.7–2.4)

## 2017-12-23 MED ORDER — METOCLOPRAMIDE HCL 5 MG PO TABS
5.0000 mg | ORAL_TABLET | Freq: Three times a day (TID) | ORAL | Status: DC
  Administered 2017-12-23 – 2017-12-25 (×6): 5 mg via ORAL
  Filled 2017-12-23 (×6): qty 1

## 2017-12-23 NOTE — Progress Notes (Addendum)
Physical Therapy Session Note  Patient Details  Name: [name] MRN: [id number] Date of Birth: May 27, 1945  Today's Date: 12/23/2017 PT Individual Time: 1330-1430 PT Individual Time Calculation (min): 60 min   Short Term Goals: Week 1:  PT Short Term Goal 1 (Week 1): Pt will complete transfers with min A consistently PT Short Term Goal 2 (Week 1): Pt will ambulate x 150 ft with min A x 1 with RW and good adherence to TDWB on LLE PT Short Term Goal 3 (Week 1): Pt will initiate stair training as safe and able  Skilled Therapeutic Interventions/Progress Updates:   Pt asleep but awakened easily.  No pain at rest.  Supine therapeutic exercises performed with LEs to increase strength for functional mobility; cues for staying in pain-free range: 10 x 1 each L/R heel slides, R/L active assistive straight leg raises, bil hip internal rotation; 2 x 10 cervical flexion, bil lower trunk rotation bil pelvic tilts; 25 x 1 bil ankle pumps, cues to count aloud to avoid Valsalva maneuver.  PROM L foot and heel cord.   Discussed opportunity for pt to talk to psychologist if she is having flashbacks or nightmares.  She denied any problems.   Pt left resting in bed with needs at hand and alarm set.     Therapy Documentation Precautions:  Precautions Precautions: Fall Precaution Comments: Clavicle fx, sternal fx, pelvic fx Restrictions Weight Bearing Restrictions: Yes LUE Weight Bearing: Weight bearing as tolerated RLE Weight Bearing: Weight bearing as tolerated LLE Weight Bearing: Touchdown weight bearing   Pain: Pain Assessment  none at rest       Therapy/Group: Individual Therapy  Cyndia Degraff 12/23/2017, 2:35 PM
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
## 2017-12-23 NOTE — Progress Notes (Signed)
Occupational Therapy Session Note  Patient Details  Name: [name] MRN: [id number] Date of Birth: September 17, 1945  Today's Date: 12/23/2017 OT Individual Time: 4098-1191 OT Individual Time Calculation (min): 71 min    Short Term Goals: Week 1:  OT Short Term Goal 1 (Week 1): Pt will don pants with min A OT Short Term Goal 2 (Week 1): Pt will complete toilet transfer with CGA OT Short Term Goal 3 (Week 1): Pt will transfer into walk in shower with CGA OT Short Term Goal 4 (Week 1): Pt will complete sit to stand with CGA  Skilled Therapeutic Interventions/Progress Updates:    Session focused on LB dressing with AE, B UE strengthening/endurance, and functional activity tolerance. Pt completed bed mobility with CGA this session, despite c/o heavily of LE pain. Pt completed stand pivot with min A to w/c. Pt completed UB bathing at sink level with set up only. Pt provided with sock aid and reacher and she was able to don/doff B socks with min A and min cueing for use of sock aid. Pt was brought to rehab gym where she transferred to mat with CGA. Pt held 2lb weighted dowel and completed functional dynamic reaching with (S). Pt stood with Rw and moderate cuing for adherence to LLE TDWB while reaching across midline, with only 1 UE support on RW to challenge dynamic standing balance. Pt completed forward reaching with 2lb dumbbell with R UE and no weight on L UE. Pt reported 8/10 hip/buttocks pain increasing and pt was brought outside for environment change and distraction. Pt returned to her room and was left sitting up with lunch tray and RN present.   Therapy Documentation Precautions:  Precautions Precautions: Fall Precaution Comments: Clavicle fx, sternal fx, pelvic fx Restrictions Weight Bearing Restrictions: Yes LUE Weight Bearing: Weight bearing as tolerated RLE Weight Bearing: Weight bearing as tolerated LLE Weight Bearing: Touchdown weight bearing    Pain: Pain Assessment Pain Scale:  0-10 Pain Score: 3  Faces Pain Scale: Hurts little more Pain Type: Acute pain Pain Location: Hip Pain Orientation: Right;Left Pain Descriptors / Indicators: Aching Pain Onset: With Activity Patients Stated Pain Goal: 0 Pain Intervention(s): Repositioned;Heat applied Therapy/Group: Individual Therapy  Crissie Reese 12/23/2017, 12:10 PM
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
## 2017-12-23 NOTE — Progress Notes (Signed)
Speech Language Pathology Daily Session Note  Patient Details  Name: Pailyn [place]vue MRN: [id number] Date of Birth: [birth year]  Today's Date: 12/23/2017 SLP Individual Time: 0930-1000 SLP Individual Time Calculation (min): 30 min  Short Term Goals: Week 1: SLP Short Term Goal 1 (Week 1): Patient will demonstrate functional problem solving for complex tasks with Mod I.  SLP Short Term Goal 2 (Week 1): Patient will recall new, daily information with Mod I.  SLP Short Term Goal 3 (Week 1): Patient will utilize word-finding strategies at the conversation level with Mod I.   Skilled Therapeutic Interventions:Skilled ST services focused on cogntive skills. SLP facilitated basic to semi-complex functional problem solving skills utilizing money management task, pt demonstrated Mod I verbal problem solving, however required mod A verbal cues for functional problem solving. SLP implemented use of written aid to support working memory pt required mod A verbal cues fading to min A verbal cues . Pt was left in room with call bell within reach and bed alarm set. Recommend to continue skilled ST services.      Function:   Pain Pain Assessment Pain Score: 0-No pain  Therapy/Group: Individual Therapy  Sarabella Caprio  [facility] 12/23/2017, 3:28 PM

## 2017-12-24 ENCOUNTER — Inpatient Hospital Stay (HOSPITAL_COMMUNITY): Payer: Medicare Other | Admitting: Physical Therapy

## 2017-12-24 ENCOUNTER — Inpatient Hospital Stay (HOSPITAL_COMMUNITY): Payer: Medicare Other

## 2017-12-24 ENCOUNTER — Inpatient Hospital Stay (HOSPITAL_COMMUNITY): Payer: Medicare Other | Admitting: Speech Pathology

## 2017-12-24 LAB — BASIC METABOLIC PANEL
Anion gap: 9 (ref 5–15)
BUN: 12 mg/dL (ref 8–23)
CHLORIDE: 99 mmol/L (ref 98–111)
CO2: 23 mmol/L (ref 22–32)
CREATININE: 0.49 mg/dL (ref 0.44–1.00)
Calcium: 8.5 mg/dL — ABNORMAL LOW (ref 8.9–10.3)
GFR calc Af Amer: >60 mL/min (ref >60)
GFR calc non Af Amer: >60 mL/min (ref >60)
Glucose, Bld: 102 mg/dL — ABNORMAL HIGH (ref 70–99)
Potassium: 4.3 mmol/L (ref 3.5–5.1)
SODIUM: 131 mmol/L — AB (ref 135–145)

## 2017-12-24 NOTE — Progress Notes (Signed)
Occupational Therapy Weekly Progress Note  Patient Details  Name: [name] MRN: [id number] Date of Birth: [birth year]  Beginning of progress report period: December 18, 2017 End of progress report period: December 24, 2017  Today's Date: 12/24/2017 OT Individual Time: 1115-1200 OT Individual Time Calculation (min): 45 min    Patient has met 4 of 4 short term goals.  Pt has made great improvements in her functional mobility, dressing/bathing with the use of AE/AD, and ADL transfers. Pt can now use AE to don pants/socks with CGA and minimal cueing. She consistently completed sit <>stand transfers with CGA.   Patient continues to demonstrate the following deficits: muscle weakness, decreased cardiorespiratoy endurance and decreased standing balance, decreased balance strategies and difficulty maintaining precautions and therefore will continue to benefit from skilled OT intervention to enhance overall performance with BADL. Pt is also limited by her high levels of hip/back pain throughout sessions.   Patient progressing toward long term goals..  Continue plan of care.  OT Short Term Goals Week 1:  OT Short Term Goal 1 (Week 1): Pt will don pants with min A OT Short Term Goal 1 - Progress (Week 1): Met OT Short Term Goal 2 (Week 1): Pt will complete toilet transfer with CGA OT Short Term Goal 2 - Progress (Week 1): Met OT Short Term Goal 3 (Week 1): Pt will transfer into walk in shower with CGA OT Short Term Goal 3 - Progress (Week 1): Met OT Short Term Goal 4 (Week 1): Pt will complete sit to stand with CGA OT Short Term Goal 4 - Progress (Week 1): Met Week 2:  OT Short Term Goal 1 (Week 2): STG= LTG d/t ELOS  Skilled Therapeutic Interventions/Progress Updates:    Session focused on b/d tasks at shower level. Pt completed functional mobility into shower with CGA, with poor adherence to TDWB LLE despite frequent vc's. Pt used long handled sponge in shower with set up only. Pt completed LB  dressing with AE at w/c level with CGA only. Pt completed grooming/oral care tasks independently sitting up at sink. High levels of pain throughout session, limiting mobility and participation. Pt left sitting up in w/c with all needs met and lunch tray present.   Therapy Documentation Precautions:  Precautions Precautions: Fall Precaution Comments: Clavicle fx, sternal fx, pelvic fx Restrictions Weight Bearing Restrictions: Yes LUE Weight Bearing: Weight bearing as tolerated RLE Weight Bearing: Weight bearing as tolerated LLE Weight Bearing: Touchdown weight bearing GPain: Pain Assessment Pain Scale: 0-10 Pain Score: 7  Pain Type: Acute pain Pain Location: Hip Pain Orientation: Left Pain Descriptors / Indicators: Aching Pain Onset: On-going Pain Intervention(s): Shower ADL: ADL Equipment Provided: Reacher, Long-handled sponge Eating: Independent Where Assessed-Eating: Edge of bed Grooming: Independent Where Assessed-Grooming: Sitting at sink Upper Body Bathing: Minimal cueing, Setup Where Assessed-Upper Body Bathing: Shower Lower Body Bathing: Minimal cueing, Supervision/safety Where Assessed-Lower Body Bathing: Shower Upper Body Dressing: Setup Where Assessed-Upper Body Dressing: Sitting at sink Lower Body Dressing: Contact guard Where Assessed-Lower Body Dressing: Sitting at sink Toileting: Minimal assistance Where Assessed-Toileting: Glass blower/designer: Psychiatric nurse Method: Counselling psychologist: Energy manager: Moderate cueing, Moderate assistance Tub/Shower Transfer Method: Stand pivot Tub/Shower Equipment: Facilities manager: Curator Method: Heritage manager: Radio broadcast assistant  Therapy/Group: Individual Therapy  Curtis Sites 12/24/2017, 12:19 PM
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
## 2017-12-24 NOTE — Progress Notes (Signed)
Speech Language Pathology Weekly Progress and Session Note  Patient Details  Name: Signe Tackitt MRN: [id number] Date of Birth: 19-May-1945  Beginning of progress report period: December 17, 2017 End of progress report period: December 24, 2017  Today's Date: 12/24/2017 SLP Individual Time: 1015-1100 SLP Individual Time Calculation (min): 45 min  Short Term Goals: Week 1: SLP Short Term Goal 1 (Week 1): Patient will demonstrate functional problem solving for complex tasks with Mod I.  SLP Short Term Goal 1 - Progress (Week 1): Not met SLP Short Term Goal 2 (Week 1): Patient will recall new, daily information with Mod I.  SLP Short Term Goal 2 - Progress (Week 1): Not met SLP Short Term Goal 3 (Week 1): Patient will utilize word-finding strategies at the conversation level with Mod I.  SLP Short Term Goal 3 - Progress (Week 1): Met    New Short Term Goals: Week 2: SLP Short Term Goal 1 (Week 2): Patient will demonstrate functional problem solving for complex tasks with supervision verbal cues.  SLP Short Term Goal 2 (Week 2): Patient will recall new, daily information with Supervision verbal cues.  SLP Short Term Goal 3 (Week 2): Patient will self-monitor and correct errors during functional tasks with supervision verbal cues.   Weekly Progress Updates: Patient has made inconsistent gains and has met 1 of 3 STGs this reporting period. Currently, patient requires overall Min A verbal cues to complete functional and familiar tasks safely in regards to recall and problem solving. However, patient is Mod I for word-finding. Suspect patient's function can be impacted at times by pain. Patient education ongoing. Patient would benefit from continued skilled SLP intervention to maximize her cognitive functioning and overall functional independence.      Intensity: Minumum of 1-2 x/day, 30 to 90 minutes Frequency: 3 to 5 out of 7 days Duration/Length of Stay: 10/15 Treatment/Interventions: Cognitive  remediation/compensation;Internal/external aids;Speech/Language facilitation;Patient/family education;Functional tasks;Cueing hierarchy;Therapeutic Activities;Environmental controls   Daily Session  Skilled Therapeutic Interventions: Skilled treatment session focused on cognitive goals. SLP facilitated session by providing Min A verbal cues to self-monitor and correct errors during a mildly complex scheduling task. Suspect patient's function impacted by pain. RN aware and patient premedicated and repositioned. Patient left upright in bed with alarm on and all needs within reach. Continue with current plan of care.       Pain Pain Assessment Pain Scale: 0-10 Pain Score: 7  Pain Type: Acute pain Pain Location: Hip Pain Orientation: Left Pain Descriptors / Indicators: Aching;Discomfort;Dull Pain Frequency: Constant Pain Onset: On-going Pain Intervention(s): Medication (See eMAR);Repositioned  Therapy/Group: Individual Therapy  Hassani Sliney 12/24/2017, 3:09 PM

## 2017-12-24 NOTE — Progress Notes (Signed)
Physical Therapy Session Note  Patient Details  Name: [name] MRN: [id number] Date of Birth: [birth year]  Today's Date: 12/24/2017 PT Individual Time: 0830-0928 PT Individual Time Calculation (min): 58 min   Short Term Goals: Week 1:  PT Short Term Goal 1 (Week 1): Pt will complete transfers with min A consistently PT Short Term Goal 2 (Week 1): Pt will ambulate x 150 ft with min A x 1 with RW and good adherence to TDWB on LLE PT Short Term Goal 3 (Week 1): Pt will initiate stair training as safe and able  Skilled Therapeutic Interventions/Progress Updates:    Pt received supine in bed with nursing present administering medications. Pt agreed to therapy and pt rolled R with min A for RN to apply pain patches to pt back. Transfers throughout session were min A for balance. Pt amb with RW 30ft CGA-min A and self-propelled w/c for 35ft to gym. Pt performed supine heel slides 2x12 B before being repositioned due to pain. TherX continued sitting edge of table: LAQ 2x12 B, HS curls 2x12 B with TB for resistance, and did ankle pumps in sitting 1x12 B w/o resistance and 1x12 B w/ TB for resistance. Pt performed standing exercises for strengthening of L LE while improving SLS on R LE. Pt performed 3x12 L hip AB, 3x12 L hip flexion, and 1x12 L knee flexion in standing. Pt took rests breaks in seated PRN. Pt stated legs feeling sore and believed it was due to extended time spent in bed. PT and pt discussed spending more time sitting in recliner in room. Pt returned to room at end of session and in need of toileting. Pt required min A for transfer and total A for moving clothes on and off. Pt was able to toilet mod I with bedside commode over toilet. Pt left in recliner with call bell and needs in reach.   Therapy Documentation Precautions:  Precautions Precautions: Fall Precaution Comments: Clavicle fx, sternal fx, pelvic fx Restrictions Weight Bearing Restrictions: Yes LUE Weight Bearing: Weight  bearing as tolerated RLE Weight Bearing: Weight bearing as tolerated LLE Weight Bearing: Touchdown weight bearing    Pain:  Pt reported pain in L hip/low back region. Repositioning and rest provided to ease pain. Pt received pain meds before PT session.     Therapy/Group: Individual Therapy  Mirenda Baltazar SPT 12/24/2017, 10:24 AM

## 2017-12-25 ENCOUNTER — Inpatient Hospital Stay (HOSPITAL_COMMUNITY): Payer: Medicare Other | Admitting: *Deleted

## 2017-12-25 ENCOUNTER — Inpatient Hospital Stay (HOSPITAL_COMMUNITY): Payer: Medicare Other

## 2017-12-25 ENCOUNTER — Inpatient Hospital Stay (HOSPITAL_COMMUNITY): Payer: Medicare Other | Admitting: Physical Therapy
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
## 2017-12-25 NOTE — Progress Notes (Signed)
Occupational Therapy Session Note  Patient Details  Name: Titiana Severa MRN: [id number] Date of Birth: [birth year]  Today's Date: 12/25/2017 OT Individual Time: [id number] OT Individual Time Calculation (min): 27 min    Short Term Goals: Week 2: STG= LTG Skilled Therapeutic Interventions/Progress Updates:    Session focused on functional mobility and static/dynamic standing balance. Pt completed standing level functional reaching activity with emphasis on adhering to TDWB LLE precautions. Pt unable to maintain precautions during transitional movement and only briefly in standing despite frequent vc. Improvements in standing balance this session, requiring only CGA- (S) during standing with unilateral support on RW. Transport entered session and took pt down to x-ray. 33 min missed d/t pt being at procedure. Pain as reported below.   Therapy Documentation Precautions:  Precautions Precautions: Fall Precaution Comments: Clavicle fx, sternal fx, pelvic fx Restrictions Weight Bearing Restrictions: Yes LUE Weight Bearing: Weight bearing as tolerated RLE Weight Bearing: Weight bearing as tolerated LLE Weight Bearing: Touchdown weight bearing   Pain: Pain Assessment Pain Scale: Faces Pain Score: 5  Faces Pain Scale: Hurts little more Pain Type: Acute pain Pain Location: Leg Pain Orientation: Right Pain Radiating Towards: leg Pain Descriptors / Indicators: Aching Pain Frequency: Intermittent Pain Onset: On-going Pain Intervention(s): Repositioned;Ambulation/increased activity   Therapy/Group: Individual Therapy  Crissie Reese 12/25/2017, 9:19 AM

## 2017-12-25 NOTE — Progress Notes (Signed)
Speech Language Pathology Daily Session Note  Patient Details  Name: [name] MRN: [id number] Date of Birth: 1945/07/24  Today's Date: 12/25/2017 SLP Individual Time: 1000-1030 SLP Individual Time Calculation (min): 30 min  Short Term Goals: Week 2: SLP Short Term Goal 1 (Week 2): Patient will demonstrate functional problem solving for complex tasks with supervision verbal cues.  SLP Short Term Goal 2 (Week 2): Patient will recall new, daily information with Supervision verbal cues.  SLP Short Term Goal 3 (Week 2): Patient will self-monitor and correct errors during functional tasks with supervision verbal cues.   Skilled Therapeutic Interventions:   Skilled ST services focused on cognitive skills. SLP facilitated semi-complex problem solving and error awareness skills utilizing completion of calender organization task and simple deductive reasoning task, pt required mod-min A verbal cues. Pt became frustrated with task, likely performance of task, stating "This is silly. I have a system" referring to calendar. SLP explained the areas of cognition covered in the tasks and with recent recall impairment pervious system will require more details, pt agreed. Pt was left in room with call bell within reach . SLP reccomends to continue skilled services.  Function:  Eating Eating                 Cognition Comprehension    Expression      Social Interaction    Problem Solving    Memory      Pain Pain Assessment Pain Scale: 0-10 Pain Score: 5  Faces Pain Scale: Hurts little more Pain Type: Acute pain Pain Location: Leg Pain Orientation: Right Pain Descriptors / Indicators: Aching Pain Onset: On-going Pain Intervention(s): Repositioned;Ambulation/increased activity  Therapy/Group: Individual Therapy  Clair Bardwell  [facility] 12/25/2017, 12:53 PM

## 2017-12-25 NOTE — Progress Notes (Signed)
Physical Therapy Session Note  Patient Details  Name: [name] MRN: [id number] Date of Birth: November 19, 1945  Today's Date: 12/25/2017 PT Individual Time: 1455-1535 PT Individual Time Calculation (min): 40 min   Short Term Goals: Week 2:  PT Short Term Goal 1 (Week 2): =LTG due to ELOS  Skilled Therapeutic Interventions/Progress Updates:  Pt received supine in bed, agreeable to PT. Pt reports no pain at rest, onset of L hip pain with movement, declines intervention. Supine to sit with Supervision with use of bedrails. SPT bed to w/c with RW and Supervision. Toilet transfer with min A and use of RW, v/c for safety and adherence to [facility] precautions while performing clothing management with min A. Ambulation 2 x 100 ft with RW and Supervision, v/c for TDWBing on LLE. Ascend/descend 4 stairs with 2 handrails and min A, focus on RW setup at top and bottom of stairs for simulation of home environment. Supine to/from sit with min A from flat bed without use of bedrails. Pt left seated in recliner in room with needs in reach and chair alarm in place.  Therapy Documentation Precautions:  Precautions Precautions: Fall Precaution Comments: Clavicle fx, sternal fx, pelvic fx Restrictions Weight Bearing Restrictions: Yes LUE Weight Bearing: Weight bearing as tolerated RLE Weight Bearing: Weight bearing as tolerated LLE Weight Bearing: Touchdown weight bearing  Therapy/Group: Individual Therapy  Peter Congo, PT, DPT  12/25/2017, 3:37 PM

## 2017-12-25 NOTE — Progress Notes (Signed)
Physical Therapy Weekly Progress Note  Patient Details  Name: [name] MRN: [id number] Date of Birth: [birth year]  Beginning of progress report period: December 18, 2017 End of progress report period: December 25, 2017  Today's Date: 12/25/2017 PT Individual Time: 1100-1200 PT Individual Time Calculation (min): 60 min   Patient has met 2 of 3 short term goals. Pt continues to make good progress during therapy sessions. She is CGA for transfers with RW and can ambulate up to 100 ft with RW and Supervision. Pt has initiated stair training with min A for 4 stairs. Pt is on target to d/c home with family next week.  Patient continues to demonstrate the following deficits muscle weakness and decreased standing balance and decreased balance strategies and therefore will continue to benefit from skilled PT intervention to increase functional independence with mobility. Pt will continue to benefit from endurance, strength, and balance training.  Patient progressing toward long term goals..  Continue plan of care.  PT Short Term Goals Week 1:  PT Short Term Goal 1 (Week 1): Pt will complete transfers with min A consistently PT Short Term Goal 1 - Progress (Week 1): Met PT Short Term Goal 2 (Week 1): Pt will ambulate x 150 ft with min A x 1 with RW and good adherence to TDWB on LLE PT Short Term Goal 2 - Progress (Week 1): Progressing toward goal PT Short Term Goal 3 (Week 1): Pt will initiate stair training as safe and able PT Short Term Goal 3 - Progress (Week 1): Met Week 2:  PT Short Term Goal 1 (Week 2): =LTG due to ELOS  Skilled Therapeutic Interventions/Progress Updates:  Pt received seated in w/c in room, agreeable to PT. Pt reports some pain in low back from laying in bed a lot, pain is not rated. Pt also reports some soreness in L hip/pelvic region, ice pack to L hip at end of therapy session. Pt transfers sit to stand with CGA throughout therapy session, v/c for safe transfer technique and  forward weight shift. Ambulation 2 x 50 ft, 1 x 100 ft with RW and Supervision, antalgic gait pattern and v/c for TDWB on LLE. Sit to/from supine with Supervision from flat surface, increased time needed due to L hip pain. Supine and seated BLE therex x 10 reps with 1.5# ankle weight on RLE and yellow theraband, some AAROM for LLE: heel slides, hip abd, SAQ, LTR (for low back stretch), LAQ, HS curls. Pt left seated in recliner in room with needs in reach set up for lunch at end of therapy session.  Therapy Documentation Precautions:  Precautions Precautions: Fall Precaution Comments: Clavicle fx, sternal fx, pelvic fx Restrictions Weight Bearing Restrictions: Yes LUE Weight Bearing: Weight bearing as tolerated RLE Weight Bearing: Weight bearing as tolerated LLE Weight Bearing: Touchdown weight bearing  Therapy/Group: Individual Therapy  Excell Seltzer, PT, DPT 12/25/2017, 12:16 PM

## 2017-12-26 ENCOUNTER — Inpatient Hospital Stay (HOSPITAL_COMMUNITY): Payer: Medicare Other | Admitting: Occupational Therapy

## 2017-12-26 ENCOUNTER — Inpatient Hospital Stay (HOSPITAL_COMMUNITY): Payer: Medicare Other | Admitting: Physical Therapy

## 2017-12-26 ENCOUNTER — Inpatient Hospital Stay (HOSPITAL_COMMUNITY): Payer: Medicare Other | Admitting: Speech Pathology

## 2017-12-26 LAB — BASIC METABOLIC PANEL
ANION GAP: 8 (ref 5–15)
BUN: 17 mg/dL (ref 8–23)
CHLORIDE: 98 mmol/L (ref 98–111)
CO2: 24 mmol/L (ref 22–32)
CREATININE: 0.53 mg/dL (ref 0.44–1.00)
Calcium: 8.9 mg/dL (ref 8.9–10.3)
GFR calc non Af Amer: >60 mL/min (ref >60)
Glucose, Bld: 101 mg/dL — ABNORMAL HIGH (ref 70–99)
POTASSIUM: 4.7 mmol/L (ref 3.5–5.1)
SODIUM: 130 mmol/L — AB (ref 135–145)

## 2017-12-26 MED ORDER — POTASSIUM CHLORIDE CRYS ER 20 MEQ PO TBCR
40.0000 meq | EXTENDED_RELEASE_TABLET | Freq: Every day | ORAL | Status: DC
  Administered 2017-12-27 – 2017-12-30 (×4): 40 meq via ORAL
  Filled 2017-12-26 (×4): qty 2

## 2017-12-26 NOTE — Progress Notes (Signed)
Social Work Patient ID: [name], female   DOB: 10/27/1945, 72 y.o.   MRN: [id number]  Met with pt and spoke with son (via phone) yesterday to review team conference.  Both aware and agreeable with targeted d/c date of 10/15 and supervision goals overall.  Son confirms plan for pt to d/c to his home where he and his wife can share in provision of 24/7 supervision.  Have scheduled for son to be here tomorrow for family ed at [facility].  Will follow up with him at that time as well.  Heidi Geerdes, LCSW
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
## 2017-12-26 NOTE — Progress Notes (Signed)
Speech Language Pathology Daily Session Note  Patient Details  Name: [name] MRN: [id number] Date of Birth: 1945/09/07  Today's Date: 12/26/2017 SLP Individual Time: 1340-1430 SLP Individual Time Calculation (min): 50 min  Short Term Goals: Week 2: SLP Short Term Goal 1 (Week 2): Patient will demonstrate functional problem solving for complex tasks with supervision verbal cues.  SLP Short Term Goal 2 (Week 2): Patient will recall new, daily information with Supervision verbal cues.  SLP Short Term Goal 3 (Week 2): Patient will self-monitor and correct errors during functional tasks with supervision verbal cues.   Skilled Therapeutic Interventions: Skilled treatment session focused on cognitive goals. Patient continues to demonstrate intellectual awareness of cognitive deficits, therefore, SLP administered the MoCA (version 7.3) to assess progress. Patient scored 19/30 points with 26 or above considered normal. Patient continues to demonstrate deficits in executive functioning, problem solving and memory.  Patient educated on results and demonstrated understanding. Patient left upright in bed with alarm on and all needs within reach. Continue with current plan of care.      Pain Pain Assessment Pain Scale: 0-10 Pain Score: 4  Pain Type: Acute pain Pain Location: Hip Pain Orientation: Left Pain Descriptors / Indicators: Aching Pain Intervention(s): Medication (See eMAR)  Therapy/Group: Individual Therapy  Dnaiel Voller 12/26/2017, 4:04 PM

## 2017-12-26 NOTE — Progress Notes (Signed)
Responded to spiritual care consult to assist with AD.  Nurse will give AD form to patient and will page chaplain services when ready.  Venida Jarvis, [place], [facility], Pager [phone]

## 2017-12-26 NOTE — Progress Notes (Addendum)
Occupational Therapy Session Note  Patient Details  Name: [name] MRN: [id number] Date of Birth: 1945/05/28  Today's Date: 12/26/2017 OT Individual Time: 1000-1115 OT Individual Time Calculation (min): 75 min    Short Term Goals: Week 2:  OT Short Term Goal 1 (Week 2): STG= LTG d/t ELOS  Skilled Therapeutic Interventions/Progress Updates:    Pt presents supine in bed, reporting increased fatigue this AM and declining shower. Pt also reporting feeling dizzy, BP taken and stable. Pt transitioned supine>sitting EOB with CGA. Seated EOB pt participating in UB strengthening exercises using 1lb, 2lb dowel rod and light weight ball. Pt performing chest press, forward/backward rows, bicep curls, straight arm raises, and ball pass throughout for 10-15x each, taking seated rest breaks after each exercise. Pt reports pain in L hip, and low back, addressed this session with rest and repositioning. Pt also performing x2 sit<>stands and brief standing tabletop activity with focus on standing tolerance and increasing ability to maintain TDWB precautions. Pt requires frequent cues to do maintain precautions. Transferred to w/c stand pivot using RW with CGA and transported pt via w/c to ADL apartment. Pt practicing furniture transfers stand pivot w/c<>couch using RW as she reports this is a goals of hers once she gets home (to be able to get up from the couch). Pt performing transfer with CGA, requires minA for sit<>Stand from couch level and intermittent cues for wt bearing status. Returned to room where pt performed stand pivot to toilet, performing toileting with overall modA (assist for clothing mgmt due to time). Pt transferred toilet>w/c>EOB with minA stand pivot. Pt left supine in bed end of session where pt was left with bed alarm set, call bell and needs within reach.   Therapy Documentation Precautions:  Precautions Precautions: Fall Precaution Comments: Clavicle fx, sternal fx, pelvic  fx Restrictions Weight Bearing Restrictions: Yes LUE Weight Bearing: Weight bearing as tolerated RLE Weight Bearing: Weight bearing as tolerated LLE Weight Bearing: Touchdown weight bearing     Therapy/Group: Individual Therapy  Orlando Penner 12/26/2017, 7:48 AM

## 2017-12-26 NOTE — Progress Notes (Signed)
Occupational Therapy Session Note  Patient Details  Name: [name] MRN: [id number] Date of Birth: 1945/08/02  Today's Date: 12/26/2017 OT Individual Time: 1431(make up time)-1458 OT Individual Time Calculation (min): 27 min    Short Term Goals: Week 2:  OT Short Term Goal 1 (Week 2): STG= LTG d/t ELOS  Skilled Therapeutic Interventions/Progress Updates:    Upon entering the room, pt supine in bed with no c/o pain this session. Pt verbalizing need for toileting. Supine >sit with supervision to EOB. Pt standing from bed with min A and ambulating from bed > toilet with CGA with use of RW. Pt seated on toilet with steady assist and able to void but unable to have BM. Pt returning to bed in same manner with increased pain in pelvis. OT placed ice and notified RN. Call bell activated and bed alarm within reach.   Therapy Documentation Precautions:  Precautions Precautions: Fall Precaution Comments: Clavicle fx, sternal fx, pelvic fx Restrictions Weight Bearing Restrictions: Yes LUE Weight Bearing: Weight bearing as tolerated RLE Weight Bearing: Weight bearing as tolerated LLE Weight Bearing: Touchdown weight bearing Vital Signs: Therapy Vitals Temp: 97.9 F (36.6 C) Temp Source: Oral Pulse Rate: 100 Resp: 18 BP: 92/74 Patient Position (if appropriate): Lying Oxygen Therapy SpO2: 96 % O2 Device: Room Air Pain: Pain Assessment Pain Scale: 0-10 Pain Score: 4  Pain Type: Acute pain Pain Location: Hip Pain Orientation: Left Pain Descriptors / Indicators: Aching Pain Intervention(s): Medication (See eMAR) ADL: ADL Equipment Provided: Reacher, Long-handled sponge Eating: Independent Where Assessed-Eating: Edge of bed Grooming: Independent Where Assessed-Grooming: Sitting at sink Upper Body Bathing: Minimal cueing, Setup Where Assessed-Upper Body Bathing: Shower Lower Body Bathing: Minimal cueing, Supervision/safety Where Assessed-Lower Body Bathing: Shower Upper Body  Dressing: Setup Where Assessed-Upper Body Dressing: Sitting at sink Lower Body Dressing: Contact guard Where Assessed-Lower Body Dressing: Sitting at sink Toileting: Minimal assistance Where Assessed-Toileting: Teacher, adult education: Curator Method: Proofreader: Engineer, technical sales: Moderate cueing, Moderate assistance Tub/Shower Transfer Method: Stand pivot Tub/Shower Equipment: Insurance underwriter: Administrator, arts Method: Designer, industrial/product: Emergency planning/management officer   Therapy/Group: Individual Therapy  Alen Bleacher 12/26/2017, 4:21 PM

## 2017-12-26 NOTE — Progress Notes (Signed)
Physical Therapy Session Note  Patient Details  Name: [name] MRN: [id number] Date of Birth: 04-05-1945  Today's Date: 12/26/2017 PT Individual Time: 0830-0925 PT Individual Time Calculation (min): 55 min   Short Term Goals: Week 2:  PT Short Term Goal 1 (Week 2): =LTG due to ELOS  Skilled Therapeutic Interventions/Progress Updates:    pt performs bed mobility supine <> sit with supervision without use of LE lifter.  Seated edge of bed pt performs 2 x 10 LAQ, ankle pumps and marching.  Pt performs gait throughout session with RW up to 64' with supervision.  W/c mobility throughout unit with supervision.  Transfers in ADL apartment to couch and recliner with pt requiring min A for sit to stand from low surfaces.  Discussed possibility of pt sleeping on sofa at home and pt says she will have to discuss with her son tomorrow. Simulated car transfer to sedan height with pt able to perform with supervision.  nustep for activity tolerance and strengthening x 5 minutes level 1. Pt left in bed with needs at hand, alarm set.  Therapy Documentation Precautions:  Precautions Precautions: Fall Precaution Comments: Clavicle fx, sternal fx, pelvic fx Restrictions Weight Bearing Restrictions: Yes LUE Weight Bearing: Weight bearing as tolerated RLE Weight Bearing: Weight bearing as tolerated LLE Weight Bearing: Touchdown weight bearing Pain: Pt c/o back and hip pain, RN present and giving pain meds and pain patches.   Therapy/Group: Individual Therapy  Ellorie Kindall 12/26/2017, 9:26 AM
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
## 2017-12-26 NOTE — Plan of Care (Signed)
  Problem: RH SAFETY Goal: RH STG ADHERE TO SAFETY PRECAUTIONS W/ASSISTANCE/DEVICE Description STG Adhere to Safety Precautions With Assistance/Device. Outcome: Progressing Goal: RH STG DECREASED RISK OF FALL WITH ASSISTANCE Description STG Decreased Risk of Fall With Assistance. Outcome: Progressing  Call light within reach, bed/chair alarm, proper footwear

## 2017-12-27 ENCOUNTER — Ambulatory Visit (HOSPITAL_COMMUNITY): Payer: Medicare Other | Admitting: Physical Therapy

## 2017-12-27 ENCOUNTER — Inpatient Hospital Stay (HOSPITAL_COMMUNITY): Payer: Medicare Other

## 2017-12-27 ENCOUNTER — Encounter (HOSPITAL_COMMUNITY): Payer: Medicare Other

## 2017-12-27 ENCOUNTER — Encounter (HOSPITAL_COMMUNITY): Payer: Medicare Other | Admitting: Speech Pathology

## 2017-12-27 LAB — BASIC METABOLIC PANEL
Anion gap: 10 (ref 5–15)
BUN: 20 mg/dL (ref 8–23)
CALCIUM: 8.7 mg/dL — AB (ref 8.9–10.3)
CO2: 25 mmol/L (ref 22–32)
CREATININE: 0.53 mg/dL (ref 0.44–1.00)
Chloride: 97 mmol/L — ABNORMAL LOW (ref 98–111)
GFR calc Af Amer: >60 mL/min (ref >60)
GFR calc non Af Amer: >60 mL/min (ref >60)
GLUCOSE: 93 mg/dL (ref 70–99)
Potassium: 4.4 mmol/L (ref 3.5–5.1)
Sodium: 132 mmol/L — ABNORMAL LOW (ref 135–145)

## 2017-12-27 NOTE — Progress Notes (Addendum)
Occupational Therapy Session Note  Patient Details  Name: [name] MRN: [id number] Date of Birth: 04/02/1945  Today's Date: 12/27/2017 OT Individual Time: 1100-1156 Session 2: 1414-1500 OT Individual Time Calculation (min): 56 min Session 2: 46 min   Short Term Goals: Week 2:  OT Short Term Goal 1 (Week 2): STG= LTG d/t ELOS  Skilled Therapeutic Interventions/Progress Updates:    Session focused on B UE strengthening/endurance for maximial use during ADL/IADLs and adherence to LLE TDWB. Edu provided re importance of TDWB on healing and recovery time. Pt completed 100 ft of w/c propulsion to therapy gym with several short rest breaks. Pt stood with RW and completed toe taps with L LE to promote increase dynamic standing balance within smaller BOS and to promote TDWB. Pt with heavy reliance on B UE on RW throughout despite cueing. Tactile cueing for adherence to TDWB throughout session, poor adherence especially during fatigue.Pt completed functional reaching overhead and across midline with CGA for dynamic balance. Pt returned to her room and completed stand pivot to recliner with all needs met and chair alarm set.   Session 2: Session focused on toileting tasks and B UE strengthening/endurance. Extensive discussion re d/c planning. Pt completed transfer to w/c and to toilet with heavy use of grab bars and poor adherence to TDWB precautions. Pt able to stand and complete peri hygiene with CGA. Vc for activity pacing. Pt propelled w/c to therapy gym with no c/o pain throughout. Pt used B UE ergometer while seated to increase B UE strength needed during ADL transfers. Vc provided for graded activity via intervals of high vs low intensity. Pt returned to room and left supine in bed with all needs met.   Therapy Documentation Precautions:  Precautions Precautions: Fall Precaution Comments: Clavicle fx, sternal fx, pelvic fx Restrictions Weight Bearing Restrictions: Yes LUE Weight Bearing:  Weight bearing as tolerated RLE Weight Bearing: Weight bearing as tolerated LLE Weight Bearing: Touchdown weight bearing  Pain: Pain Assessment Pain Scale: Faces Faces Pain Scale: Hurts a little bit Pain Type: Acute pain Pain Location: Leg Pain Orientation: Left Pain Descriptors / Indicators: Aching Pain Onset: With Activity Pain Intervention(s): Emotional support;Ambulation/increased activity   Therapy/Group: Individual Therapy  Curtis Sites 12/27/2017, 12:18 PM

## 2017-12-27 NOTE — Progress Notes (Signed)
[facility] PHYSICAL MEDICINE & REHABILITATION     PROGRESS NOTE    Subjective/Complaints: A little upset over an interaction with nurse this morning. Stools decreasing in frequency.   ROS: Patient denies fever, rash, sore throat, blurred vision, nausea, vomiting, diarrhea, cough, shortness of breath or chest pain, headache, or mood change.     Objective:  Dg Pelvis Comp Min 3v  Result Date: 12/25/2017 CLINICAL DATA:  Follow-up pelvic fractures. EXAM: [facility] PELVIS - 3+ VIEW COMPARISON:  Pelvic CT, 12/10/2017.  Pelvis radiographs, 12/09/2017. FINDINGS: Long screw extends from left-to-right through the posterior iliac bones crossing the sacrum, fixating the SI joints. SI joints appear normally spaced and aligned. Fixation plate and screws extend from the medial right acetabulum across the symphysis pubis, fixating the right parasymphyseal pubic bone fracture. Three screws protrude anteriorly and superiorly from the fixation plate, not evident on the prior studies. There is no lucency adjacent to the screw threads to suggest loosening. Fracture of the medial left acetabular wall is similar to the prior exams as is the left ilium fracture. IMPRESSION: 1. ORIF of pelvic fractures as described. 2. Since prior exam, 3 screws along right aspect of the pubic fracture fixation plate are now protruding anteriorly superiorly, without convincing evidence of loosening of the screws. The suggest change in the fracture alignment. 3. No other change. Electronically Signed   By: Amie Portland M.D.   On: 12/25/2017 12:34   No results for input(s): WBC, HGB, HCT, PLT in the last 72 hours. Recent Labs    12/26/17 0450 12/27/17 0450  NA 130* 132*  K 4.7 4.4  CL 98 97*  GLUCOSE 101* 93  BUN 17 20  CREATININE 0.53 0.53  CALCIUM 8.9 8.7*   CBG (last 3)  No results for input(s): GLUCAP in the last 72 hours.  Wt Readings from Last 3 Encounters:  12/25/17 46.2 kg  12/09/17 47.5 kg  01/31/16 47.6 kg      Intake/Output Summary (Last 24 hours) at 12/27/2017 0921 Last data filed at 12/27/2017 0805 Gross per 24 hour  Intake 600 ml  Output -  Net 600 ml    Vital Signs: Blood pressure (!) 150/86, pulse 89, temperature 97.7 F (36.5 C), temperature source Oral, resp. rate 15, height 5\' 3"  (1.6 m), weight 46.2 kg, SpO2 97 %. Physical Exam:  Constitutional: No distress . Vital signs reviewed. HEENT: EOMI, oral membranes moist Neck: supple Cardiovascular: RRR without murmur. No JVD    Respiratory: CTA Bilaterally without wheezes or rales. Normal effort    GI: BS +, non-tender, non-distended  Musculoskeletal:Feet with chronic vascular changes--stable Neurological: She isalertand oriented Alert  Reasonable insight and awareness.  Speech clear.  RUE: Grossly 4/5 proximal to distal RLE: Grossly 4-/5 proximal distal  LUE 4-/5 proximal distal (no change) LLE: 2 to 2+/5 proximal to distal ---limited by pain  Skin:Intact.  Warm and dry. Bruising less on left hip resolving  Assessment/Plan: 1. Functional and mobility deficits secondary to polytrauma which require 3+ hours per day of interdisciplinary therapy in a comprehensive inpatient rehab setting. Physiatrist is providing close team supervision and 24 hour management of active medical problems listed below. Physiatrist and rehab team continue to assess barriers to discharge/monitor patient progress toward functional and medical goals.  Function:  Bathing Bathing position      Bathing parts      Bathing assist        Upper Body Dressing/Undressing Upper body dressing  Upper body assist        Lower Body Dressing/Undressing Lower body dressing                                  Lower body assist        Toileting Toileting          Toileting assist     Transfers Chair/bed transfer             Locomotion Ambulation           Wheelchair           Cognition Comprehension    Expression    Social Interaction    Problem Solving    Memory     Medical Problem List and Plan: 1.Funtional and moblity deficitssecondary to polytrauma including multiple pelvic fractures,left clavicle fx,  concussion -Continue CIR therapies including PT, OT   2. DVT Prophylaxis/Anticoagulation: Pharmaceutical:Lovenox 3. Pain Management:continue scheduled tylenol qid with ultram prn. Ice prn.improving  -dc'ed gabapentin due to neurosedating SE  -heating pad, lidoderm patches 4. Mood:LCSWto follow for evaluation and support 5. Neuropsych: This patientiscapable of making decisions on iaown behalf. 6. Skin/Wound Care:?mild raynaud's  7. Fluids/Electrolytes/Nutrition:Monitor I's and O's.     -continue to push po intake 8.Colonic distention/ileus:    -resolved 9. Hypokalemia: supplementkeepingpotassium>4.0  Potassium 4.4 10/11   recheck Monday   10. ABLA:Hemoglobin 10.0 10/7 12.Hypomagnesemia:    -2.0 on 10/7.  -continue oral supplement until discharge 13. Hyponatremia: up to 132 10/11  -off reglan  -recheck Monday   LOS (Days) 10 A FACE TO FACE EVALUATION WAS PERFORMED  Ranelle Oyster, MD 12/27/2017 9:21 AM

## 2017-12-27 NOTE — Progress Notes (Signed)
Speech Language Pathology Daily Session Note  Patient Details  Name: Cymone Yeske MRN: [id number] Date of Birth: [birth year]  Today's Date: 12/27/2017 SLP Individual Time: 1030-1055 SLP Individual Time Calculation (min): 25 min  Short Term Goals: Week 2: SLP Short Term Goal 1 (Week 2): Patient will demonstrate functional problem solving for complex tasks with supervision verbal cues.  SLP Short Term Goal 2 (Week 2): Patient will recall new, daily information with Supervision verbal cues.  SLP Short Term Goal 3 (Week 2): Patient will self-monitor and correct errors during functional tasks with supervision verbal cues.   Skilled Therapeutic Interventions: Skilled treatment session focused on completion of family education with the patient's son and cognitive goals. SLP facilitated session by educating the patient's son in regards to her current cognitive impairments and strategies to utilize at home to maximize her recall and carryover of functional information. He verbalized understanding but reported he suspects patient is at her cognitive baseline and memory deficits may be due to medications. Continue education provided. SLP also facilitated session by providing supervision verbal cues for problem solving during a mildly complex medication management task. Patient left upright in wheelchair with all needs within reach. Continue with current plan of care.      Pain Patient with intermittent pain, patient premedicated and repositioned   Therapy/Group: Individual Therapy  Ivanell Deshotel 12/27/2017, 11:07 AM

## 2017-12-27 NOTE — Plan of Care (Signed)
  Problem: RH SKIN INTEGRITY Goal: RH STG SKIN FREE OF INFECTION/BREAKDOWN Outcome: Progressing Goal: RH STG MAINTAIN SKIN INTEGRITY WITH ASSISTANCE Description: STG Maintain Skin Integrity With Assistance. Outcome: Progressing Goal: RH STG ABLE TO PERFORM INCISION/WOUND CARE W/ASSISTANCE Description: STG Able To Perform Incision/Wound Care With Assistance. Outcome: Progressing   Problem: RH SAFETY Goal: RH STG ADHERE TO SAFETY PRECAUTIONS W/ASSISTANCE/DEVICE Description: STG Adhere to Safety Precautions With Assistance/Device. Outcome: Progressing Goal: RH STG DECREASED RISK OF FALL WITH ASSISTANCE Description: STG Decreased Risk of Fall With Assistance. Outcome: Progressing   

## 2017-12-27 NOTE — Progress Notes (Signed)
Patients son inquiring about obtaining a notary from the hospital to transfer a title for her car insurance.  Contacted chaplain who can provide notary services but was informed they only provide services for advanced directives.  Will notify patient and son of this information.  Dani Gobble, RN

## 2017-12-27 NOTE — Progress Notes (Signed)
Physical Therapy Session Note  Patient Details  Name: [name] MRN: [id number] Date of Birth: September 24, 1945  Today's Date: 12/27/2017 PT Individual Time: 0930-1025 PT Individual Time Calculation (min): 55 min   Short Term Goals: Week 2:  PT Short Term Goal 1 (Week 2): =LTG due to ELOS  Skilled Therapeutic Interventions/Progress Updates:    session focused on pt/family education with pt and her son.  Pt's son observed pt perform bed mobility, sit <> stand and stand pivot transfers with RW at supervision level. Gait in home and controlled environments with supervision with RW.  Furniture transfers including simulation of sleeping on the couch as this is the plan when pt returns home. Pt able to perform with supervision.  W/c mobility with supervision throughout unit.  Simulated car transfer with supervision, cuing for safety.  Stair negotiation with 1 handrail x 4 steps with min A, cues for sequencing and maintaining TDWB.  Pt/son educated on recommendation for w/c for community distances and HHPT, both verbalize understanding. Pt given HEP of seated therex for LE strengthening at home. Pt/son state they feel comfortable with pt d/c home next week.  Therapy Documentation Precautions:  Precautions Precautions: Fall Precaution Comments: Clavicle fx, sternal fx, pelvic fx Restrictions Weight Bearing Restrictions: Yes LUE Weight Bearing: Weight bearing as tolerated RLE Weight Bearing: Weight bearing as tolerated LLE Weight Bearing: Touchdown weight bearing Pain:  pt c/o pain in back and pelvis with mobility throughout session. Rest and repositioned as needed, meds given prior to session   Therapy/Group: Individual Therapy  DONAWERTH,KAREN 12/27/2017, 10:24 AM

## 2017-12-28 ENCOUNTER — Inpatient Hospital Stay (HOSPITAL_COMMUNITY): Payer: Medicare Other | Admitting: Physical Therapy

## 2017-12-28 NOTE — Progress Notes (Signed)
[place] PHYSICAL MEDICINE & REHABILITATION     PROGRESS NOTE    Subjective/Complaints: No issues today.  Denies any pains  ROS: Patient denies nausea, vomiting, diarrhea, constipation, no chest pain or shortness of breath    Objective:  No results found. No results for input(s): WBC, HGB, HCT, PLT in the last 72 hours. Recent Labs    12/26/17 0450 12/27/17 0450  NA 130* 132*  K 4.7 4.4  CL 98 97*  GLUCOSE 101* 93  BUN 17 20  CREATININE 0.53 0.53  CALCIUM 8.9 8.7*   CBG (last 3)  No results for input(s): GLUCAP in the last 72 hours.  Wt Readings from Last 3 Encounters:  12/27/17 41.1 kg  12/09/17 47.5 kg  01/31/16 47.6 kg     Intake/Output Summary (Last 24 hours) at 12/28/2017 1254 Last data filed at 12/28/2017 0830 Gross per 24 hour  Intake 480 ml  Output -  Net 480 ml    Vital Signs: Blood pressure 130/76, pulse 90, temperature 98 F (36.7 C), temperature source Oral, resp. rate 18, height 5\' 3"  (1.6 m), weight 41.1 kg, SpO2 94 %. Physical Exam:  Constitutional: No distress . Vital signs reviewed. HEENT: EOMI, oral membranes moist Neck: supple Cardiovascular: RRR without murmur. No JVD    Respiratory: CTA Bilaterally without wheezes or rales. Normal effort    GI: BS +, non-tender, non-distended  Musculoskeletal:Feet with chronic vascular changes--stable Neurological: She isalertand oriented Alert  Reasonable insight and awareness.  Speech clear.  RUE: Grossly 4/5 proximal to distal RLE: Grossly 4-/5 proximal distal  LUE 4-/5 proximal distal (no change) LLE: 2 to 2+/5 proximal to distal ---limited by pain  Skin:Intact.  Warm and dry. Bruising less on left hip resolving  Assessment/Plan: 1. Functional and mobility deficits secondary to polytrauma which require 3+ hours per day of interdisciplinary therapy in a comprehensive inpatient rehab setting. Physiatrist is providing close team supervision and 24 hour management of active medical  problems listed below. Physiatrist and rehab team continue to assess barriers to discharge/monitor patient progress toward functional and medical goals.  Function:  Bathing Bathing position      Bathing parts      Bathing assist        Upper Body Dressing/Undressing Upper body dressing                    Upper body assist        Lower Body Dressing/Undressing Lower body dressing                                  Lower body assist        Toileting Toileting          Toileting assist     Transfers Chair/bed transfer             Locomotion Ambulation           Wheelchair          Cognition Comprehension    Expression    Social Interaction    Problem Solving    Memory     Medical Problem List and Plan: 1.Funtional and moblity deficitssecondary to polytrauma including multiple pelvic fractures,left clavicle fx,  concussion -Continue CIR therapies including PT, OT   2. DVT Prophylaxis/Anticoagulation: Pharmaceutical:Lovenox 3. Pain Management:continue scheduled tylenol qid with ultram prn. Ice prn.improving  -dc'ed gabapentin due to neurosedating SE  -heating pad, lidoderm  patches 4. Mood:LCSWto follow for evaluation and support 5. Neuropsych: This patientiscapable of making decisions on iaown behalf. 6. Skin/Wound Care:?mild raynaud's  7. Fluids/Electrolytes/Nutrition:Monitor I's and O's.     -continue to push po intake  9. Hypokalemia: supplementkeepingpotassium>4.0  Potassium 4.4 10/11   recheck Monday   10. ABLA:Hemoglobin 10.0 10/7 12.Hypomagnesemia:    -2.0 on 10/7.  -continue oral supplement until discharge 13. Hyponatremia: up to 132 10/11   -recheck Monday   LOS (Days) 11 A FACE TO FACE EVALUATION WAS PERFORMED  Erick Colace, MD 12/28/2017 12:54 PM

## 2017-12-28 NOTE — Progress Notes (Signed)
Physical Therapy Session Note  Patient Details  Name: [name] MRN: [id number] Date of Birth: 1945-12-12  Today's Date: 12/28/2017 PT Individual Time: 1515-1555 PT Individual Time Calculation (min): 40 min   Short Term Goals: Week 2:  PT Short Term Goal 1 (Week 2): =LTG due to ELOS  Skilled Therapeutic Interventions/Progress Updates:   Pt in supine and agreeable to therapy w/ encouragement, denies pain. Session focused on functional mobility and global strengthening. Total assist w/c mobility to outside and ambulated 60' x2 w/ RW in community environment and on uneven surfaces - close supervision. Pt appreciative to have spent some time outside. Pt self-propelled w/c back towards unit in 100-200' bouts w/ brief rest in between for endurance training and UE strengthening. Performed toilet transfer w/ supervision prior to ending session in recliner, all needs in reach.   Therapy Documentation Precautions:  Precautions Precautions: Fall Precaution Comments: Clavicle fx, sternal fx, pelvic fx Restrictions Weight Bearing Restrictions: Yes LUE Weight Bearing: Weight bearing as tolerated RLE Weight Bearing: Weight bearing as tolerated LLE Weight Bearing: Touchdown weight bearing Vital Signs: Therapy Vitals Temp: 98.1 F (36.7 C) Temp Source: Oral Pulse Rate: 92 Resp: 17 BP: 133/63 Patient Position (if appropriate): Sitting Oxygen Therapy SpO2: 98 % O2 Device: Room Air  Therapy/Group: Individual Therapy  [name] 12/28/2017, 3:58 PM

## 2017-12-29 ENCOUNTER — Inpatient Hospital Stay (HOSPITAL_COMMUNITY): Payer: Medicare Other | Admitting: Occupational Therapy

## 2017-12-29 NOTE — Progress Notes (Signed)
[place] PHYSICAL MEDICINE & REHABILITATION     PROGRESS NOTE    Subjective/Complaints: No new issues overnight, denies any significant pains  ROS: Patient denies nausea, vomiting, diarrhea, constipation, no chest pain or shortness of breath    Objective:  No results found. No results for input(s): WBC, HGB, HCT, PLT in the last 72 hours. Recent Labs    12/27/17 0450  NA 132*  K 4.4  CL 97*  GLUCOSE 93  BUN 20  CREATININE 0.53  CALCIUM 8.7*   CBG (last 3)  No results for input(s): GLUCAP in the last 72 hours.  Wt Readings from Last 3 Encounters:  12/27/17 41.1 kg  12/09/17 47.5 kg  01/31/16 47.6 kg     Intake/Output Summary (Last 24 hours) at 12/29/2017 1115 Last data filed at 12/29/2017 0900 Gross per 24 hour  Intake 483 ml  Output -  Net 483 ml    Vital Signs: Blood pressure (!) 151/94, pulse (!) 102, temperature 98 F (36.7 C), temperature source Oral, resp. rate 18, height 5\' 3"  (1.6 m), weight 41.1 kg, SpO2 97 %. Physical Exam:  Constitutional: No distress . Vital signs reviewed. HEENT: EOMI, oral membranes moist Neck: supple Cardiovascular: RRR without murmur. No JVD    Respiratory: CTA Bilaterally without wheezes or rales. Normal effort    GI: BS +, non-tender, non-distended  Musculoskeletal:Feet with chronic vascular changes--stable Neurological: She isalertand oriented Alert  Reasonable insight and awareness.  Speech clear.  RUE: Grossly 4/5 proximal to distal RLE: Grossly 4-/5 proximal distal  LUE 4-/5 proximal distal (no change) LLE: 2 to 2+/5 proximal to distal ---limited by pain  Skin:Intact.  Warm and dry. Bruising less on left hip resolving  Assessment/Plan: 1. Functional and mobility deficits secondary to polytrauma which require 3+ hours per day of interdisciplinary therapy in a comprehensive inpatient rehab setting. Physiatrist is providing close team supervision and 24 hour management of active medical problems listed  below. Physiatrist and rehab team continue to assess barriers to discharge/monitor patient progress toward functional and medical goals.  Function:  Bathing Bathing position      Bathing parts      Bathing assist        Upper Body Dressing/Undressing Upper body dressing                    Upper body assist        Lower Body Dressing/Undressing Lower body dressing                                  Lower body assist        Toileting Toileting          Toileting assist     Transfers Chair/bed transfer             Locomotion Ambulation           Wheelchair          Cognition Comprehension    Expression    Social Interaction    Problem Solving    Memory     Medical Problem List and Plan: 1.Funtional and moblity deficitssecondary to polytrauma including multiple pelvic fractures,left clavicle fx,  concussion -Continue CIR therapies including PT, OT   2. DVT Prophylaxis/Anticoagulation: Pharmaceutical:Lovenox 3. Pain Management:continue scheduled tylenol qid with ultram prn. Ice prn.improving  -dc'ed gabapentin due to neurosedating SE, no pain complaints today  -heating pad, lidoderm patches 4.  Mood:LCSWto follow for evaluation and support 5. Neuropsych: This patientiscapable of making decisions on iaown behalf. 6. Skin/Wound Care:?mild raynaud's  7. Fluids/Electrolytes/Nutrition:Monitor I's and O's.     -continue to push po intake  9. Hypokalemia: supplementkeepingpotassium>4.0  Potassium 4.4 10/11   recheck in a.m.   10. ABLA:Hemoglobin 10.0 10/7 12.Hypomagnesemia:    -2.0 on 10/7.  -continue oral supplement until discharge 13. Hyponatremia: up to 132 10/11   -recheck in a.m.   LOS (Days) 12 A FACE TO FACE EVALUATION WAS PERFORMED  Erick Colace, MD 12/29/2017 11:15 AM

## 2017-12-29 NOTE — Plan of Care (Signed)
  Problem: Consults Goal: RH GENERAL PATIENT EDUCATION Description See Patient Education module for education specifics. Outcome: Progressing Goal: Skin Care Protocol Initiated - if Braden Score 18 or less Description If consults are not indicated, leave blank or document N/A Outcome: Progressing Goal: Nutrition Consult-if indicated Outcome: Progressing Goal: Diabetes Guidelines if Diabetic/Glucose > 140 Description If diabetic or lab glucose is > 140 mg/dl - Initiate Diabetes/Hyperglycemia Guidelines & Document Interventions  Outcome: Progressing   Problem: RH BOWEL ELIMINATION Goal: RH STG MANAGE BOWEL WITH ASSISTANCE Description STG Manage Bowel with mod I Assistance.  Outcome: Progressing Goal: RH STG MANAGE BOWEL W/MEDICATION W/ASSISTANCE Description STG Manage Bowel with Medication with mod I  Assistance.  Outcome: Progressing   Problem: RH BLADDER ELIMINATION Goal: RH STG MANAGE BLADDER WITH ASSISTANCE Description STG Manage Bladder With mod I Assistance  Outcome: Progressing   Problem: RH SKIN INTEGRITY Goal: RH STG SKIN FREE OF INFECTION/BREAKDOWN Outcome: Progressing Goal: RH STG MAINTAIN SKIN INTEGRITY WITH ASSISTANCE Description STG Maintain Skin Integrity With mod I Assistance.  Outcome: Progressing Goal: RH STG ABLE TO PERFORM INCISION/WOUND CARE W/ASSISTANCE Description STG Able To Perform Incision/Wound Care With min Assistance.  Outcome: Progressing   Problem: RH SAFETY Goal: RH STG ADHERE TO SAFETY PRECAUTIONS W/ASSISTANCE/DEVICE Description STG Adhere to Safety Precautions With mod I Assistance/Device.  Outcome: Progressing Goal: RH STG DECREASED RISK OF FALL WITH ASSISTANCE Description STG Decreased Risk of Fall With mod I Assistance.  Outcome: Progressing   Problem: RH PAIN MANAGEMENT Goal: RH STG PAIN MANAGED AT OR BELOW PT'S PAIN GOAL Description Less than 3 out of 10  Outcome: Progressing   Problem: RH KNOWLEDGE DEFICIT  GENERAL Goal: RH STG INCREASE KNOWLEDGE OF SELF CARE AFTER HOSPITALIZATION Outcome: Progressing
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
## 2017-12-30 ENCOUNTER — Inpatient Hospital Stay (HOSPITAL_COMMUNITY): Payer: Medicare Other | Admitting: Occupational Therapy

## 2017-12-30 ENCOUNTER — Inpatient Hospital Stay (HOSPITAL_COMMUNITY): Payer: Medicare Other | Admitting: Speech Pathology

## 2017-12-30 ENCOUNTER — Inpatient Hospital Stay (HOSPITAL_COMMUNITY): Payer: Medicare Other | Admitting: Physical Therapy

## 2017-12-30 ENCOUNTER — Inpatient Hospital Stay (HOSPITAL_COMMUNITY): Payer: Medicare Other

## 2017-12-30 LAB — CBC
HCT: 33.5 % — ABNORMAL LOW (ref 36.0–46.0)
Hemoglobin: 10.4 g/dL — ABNORMAL LOW (ref 12.0–15.0)
MCH: 31.1 pg (ref 26.0–34.0)
MCHC: 31 g/dL (ref 30.0–36.0)
MCV: 100.3 fL — ABNORMAL HIGH (ref 80.0–100.0)
Platelets: 479 10*3/uL — ABNORMAL HIGH (ref 150–400)
RBC: 3.34 MIL/uL — ABNORMAL LOW (ref 3.87–5.11)
RDW: 15.6 % — ABNORMAL HIGH (ref 11.5–15.5)
WBC: 5.8 10*3/uL (ref 4.0–10.5)
nRBC: 0 % (ref 0.0–0.2)

## 2017-12-30 LAB — URINALYSIS, COMPLETE (UACMP) WITH MICROSCOPIC
Bilirubin Urine: NEGATIVE
Glucose, UA: NEGATIVE mg/dL
Hgb urine dipstick: NEGATIVE
Ketones, ur: NEGATIVE mg/dL
Nitrite: NEGATIVE
Protein, ur: NEGATIVE mg/dL
Specific Gravity, Urine: 1.026 (ref 1.005–1.030)
pH: 6 (ref 5.0–8.0)

## 2017-12-30 LAB — BASIC METABOLIC PANEL
Anion gap: 7 (ref 5–15)
BUN: 16 mg/dL (ref 8–23)
CHLORIDE: 101 mmol/L (ref 98–111)
CO2: 25 mmol/L (ref 22–32)
Calcium: 9.4 mg/dL (ref 8.9–10.3)
Creatinine, Ser: 0.5 mg/dL (ref 0.44–1.00)
GFR calc Af Amer: >60 mL/min (ref >60)
GFR calc non Af Amer: >60 mL/min (ref >60)
GLUCOSE: 102 mg/dL — AB (ref 70–99)
POTASSIUM: 3.9 mmol/L (ref 3.5–5.1)
Sodium: 133 mmol/L — ABNORMAL LOW (ref 135–145)

## 2017-12-30 MED ORDER — TRAMADOL HCL 50 MG PO TABS
50.0000 mg | ORAL_TABLET | Freq: Two times a day (BID) | ORAL | Status: DC | PRN
  Filled 2017-12-30: qty 1

## 2017-12-30 MED ORDER — POTASSIUM CHLORIDE CRYS ER 20 MEQ PO TBCR: 20.0000 meq | EXTENDED_RELEASE_TABLET | Freq: Every day | ORAL | Status: DC

## 2017-12-30 MED ORDER — ACETAMINOPHEN 325 MG PO TABS
650.0000 mg | ORAL_TABLET | Freq: Three times a day (TID) | ORAL | Status: DC
  Administered 2017-12-30 – 2017-12-31 (×3): 650 mg via ORAL
  Filled 2017-12-30 (×3): qty 2

## 2017-12-30 NOTE — Progress Notes (Signed)
Up to void 5 times in past 12 hours. Pain managed with scheduled meds.[name] A
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
## 2017-12-30 NOTE — Progress Notes (Signed)
Speech Language Pathology Session Note & Discharge Summary  Patient Details  Name: [name] MRN: [id number] Date of Birth: 10-19-1945  Today's Date: 12/30/2017 SLP Individual Time: 0925-1020 SLP Individual Time Calculation (min): 55 min  Skilled Therapeutic Interventions:  Skilled treatment session focused on completing family education with the patient and her daughter. Upon arrival, patient was sitting upright in the wheelchair and reporting pain. Patient transferred to the bed via the RW with supervision for repositioning. SLP facilitated session by providing education in regards to the patient's current cognitive impairments and strategies to utilize at home to maximize recall of new, daily information and overall safety. All verbalized understanding and handouts were also given to reinforce information. SLP also facilitated discussion in regards to d/c planning and f/u appointments if patient moves to [facility] with daughter. CSW and PA also participated in discussions. Patient left upright in bed with alarm on and family present. Continue with current plan of care.   Patient has met 4 of 4 long term goals.  Patient to discharge at overall Supervision level.   Reasons goals not met: N/A   Clinical Impression/Discharge Summary: Patient has made functional gains and has met 4 of 4 LTGs this admission. Currently, patient requires overall supervision level verbal cues to complete functional and mildly complex tasks safely in regards to complex problem solving and recall. Patient is also overall Mod I for complex word-finding at the conversation level. Patient and family education is complete and patient will discharge home with 24 hour supervision from family. Patient would benefit from f/u SLP services to maximize her cognitive functioning and overall functional independence in order to reduce caregiver burden.   Care Partner:  Caregiver Able to Provide Assistance: Yes  Type of Caregiver Assistance:  Physical;Cognitive  Recommendation:  24 hour supervision/assistance;Home Health SLP  Rationale for SLP Follow Up: Reduce caregiver burden;Maximize cognitive function and independence   Equipment: N/A   Reasons for discharge: Treatment goals met;Discharged from hospital   Patient/Family Agrees with Progress Made and Goals Achieved: Yes    Sanay Belmar 12/30/2017, 10:22 AM
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
## 2017-12-31 DIAGNOSIS — R350 Frequency of micturition: Secondary | ICD-10-CM

## 2017-12-31 LAB — URINE CULTURE

## 2017-12-31 MED ORDER — METHOCARBAMOL 500 MG PO TABS: 500.0000 mg | ORAL_TABLET | Freq: Three times a day (TID) | ORAL | 0 refills | Status: AC | PRN

## 2017-12-31 MED ORDER — MAGNESIUM GLUCONATE 500 MG PO TABS: 500.0000 mg | ORAL_TABLET | Freq: Every day | ORAL | 0 refills | Status: AC

## 2017-12-31 MED ORDER — POLYETHYLENE GLYCOL 3350 17 G PO PACK: 17.0000 g | PACK | Freq: Every day | ORAL | 0 refills | Status: AC

## 2017-12-31 MED ORDER — DOCUSATE SODIUM 100 MG PO CAPS: 100.0000 mg | ORAL_CAPSULE | Freq: Two times a day (BID) | ORAL | 0 refills | Status: AC

## 2017-12-31 MED ORDER — POTASSIUM CHLORIDE CRYS ER 20 MEQ PO TBCR: 20.0000 meq | EXTENDED_RELEASE_TABLET | Freq: Every day | ORAL | 0 refills | Status: AC

## 2017-12-31 NOTE — Progress Notes (Signed)
Social Work  Discharge Note  The overall goal for the admission was met for:   Discharge location: Yes - pt to d/c to her home with daughter staying with her to provide 24/7 assistance.  Length of Stay: Yes - 14 days  Discharge activity level: Yes - supervision overall  Home/community participation: Yes  Services provided included: MD, RD, PT, OT, RN, TR, Pharmacy and SW  Financial Services: [place] [employer]  Follow-up services arranged: Home Health: PT, OT via [place], DME: 16x16 lightweight w/c, cushion via [facility] and Patient/Family has no preference for HH/DME agencies  Comments (or additional information):  Patient/Family verbalized understanding of follow-up arrangements: Yes  Individual responsible for coordination of the follow-up plan: pt  Confirmed correct DME delivered: Merrick Feutz 12/31/2017    Zahari Fazzino
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
## 2017-12-31 NOTE — Discharge Instructions (Signed)
Inpatient Rehab Discharge Instructions  Sabrina Keough Discharge date and time: 12/31/2017   Activities/Precautions/ Functional Status: Activity: no lifting, driving, or strenuous exercise till cleared by MD. Touchdown weight on Left leg.  Diet: regular diet Wound Care: keep wound clean and dry Contact MD if you develop any problems with your incision/wound--redness, swelling, increase in pain, drainage or if you develop fever or chills.   Functional status:  ___ No restrictions     ___ Walk up steps independently _X__ 24/7 supervision/assistance   ___ Walk up steps with assistance ___ Intermittent supervision/assistance  ___ Bathe/dress independently ___ Walk with walker     __X_ Bathe/dress with assistance ___ Walk Independently    ___ Shower independently _X__ Walk with assistance    ___ Shower with assistance _X__ No alcohol     ___ Return to work/school ________    COMMUNITY REFERRALS UPON DISCHARGE:    Home Health:   PT     OT                     Agency:  [facility] Phone: [phone]   Medical Equipment/Items Ordered:  Wheelchair and cushion                                                      Agency/Supplier:  [facility]   Special Instructions:    My questions have been answered and I understand these instructions. I will adhere to these goals and the provided educational materials after my discharge from the hospital.  Patient/Caregiver Signature _______________________________ Date __________  Clinician Signature _______________________________________ Date __________  Please bring this form and your medication list with you to all your follow-up doctor's appointments.

## 2017-12-31 NOTE — Progress Notes (Signed)
Patient and family received discharge instructions from Pam Love, PA-C with verbal understanding. Patient discharged to home with family and patient belongings. 

## 2018-01-02 ENCOUNTER — Telehealth: Payer: Self-pay | Admitting: Registered Nurse

## 2018-01-02 NOTE — Telephone Encounter (Signed)
Placed a call to Ms. Flaum, she states she is moving to [place] in a few days. She was given office phone number in case her plans change, she verbalizes understanding.

## 2018-01-03 ENCOUNTER — Telehealth: Payer: Self-pay | Admitting: Registered Nurse

## 2018-01-03 NOTE — Telephone Encounter (Signed)
Transitional Care call Transitional Care Call Completed Spoke with Natalia Leatherwood Daughter  Patient name: [name]  DOB: 1945/10/02 1. Are you/is patient experiencing any problems since coming home? No a. Are there any questions regarding any aspect of care? No 2. Are there any questions regarding medications administration/dosing? No a. Are meds being taken as prescribed? Yes b. "Patient should review meds with caller to confirm"  Medication Reviewed 3. Have there been any falls? No 4. Has Home Health been to the house and/or have they contacted you? Yes, [facility]. I spoke with Yehuda Mao the Physical Therapist today from [facility] she reports Ms. Hodsdon has two pressure ulcers. One in the thoracic region and sacral  Region. Order was given for nurse visit. Spoke with Delle Reining PA, she reviewed  Hospital chart no documentation noted  Regarding pressure ulcers.  a. If not, have you tried to contact them? NA b. Can we help you contact them? NA 5. Are bowels and bladder emptying properly? Yes a. Are there any unexpected incontinence issues? No 6. If applicable, is patient following bowel/bladder programs? NA Any fevers, problems with breathing, unexpected pain? No 7. Are there any skin problems or new areas of breakdown? Jeanine the Physical Therapist from [facility] reports pressure ulcers on her thoracic and sacrum. Nurse Visit ordered.  8. Has the patient/family member arranged specialty MD follow up (ie cardiology/neurology/renal/surgical/etc.)?  Yes a. Can we help arrange? NA 9. Does the patient need any other services or support that we can help arrange? No 10. Are caregivers following through as expected in assisting the patient? Yes 11. Has the patient quit smoking, drinking alcohol, or using drugs as recommended? Natalia Leatherwood reports Ms. Reesor smokes cigarettes and occasionally drinks alcohol, denies illicit drugs. She was encouraged to have Ms. Desena educated on  smoking cessation and no alcohol at this time. She verbalizes understanding.    Appointment date/time 01/07/2018  arrival time 10:00, for 10:20 appointment with Dr. Riley Kill. At [address] [facility] [address]

## 2018-01-05 DIAGNOSIS — R350 Frequency of micturition: Secondary | ICD-10-CM

## 2018-01-05 DIAGNOSIS — E871 Hypo-osmolality and hyponatremia: Secondary | ICD-10-CM

## 2018-01-05 DIAGNOSIS — S060X9A Concussion with loss of consciousness of unspecified duration, initial encounter: Secondary | ICD-10-CM

## 2018-01-05 DIAGNOSIS — S060XAA Concussion with loss of consciousness status unknown, initial encounter: Secondary | ICD-10-CM

## 2018-01-06 ENCOUNTER — Telehealth: Payer: Self-pay

## 2018-01-06 NOTE — Telephone Encounter (Signed)
Yehuda Mao, PT/[facility] called requesting verbal orders for PT 1wk1, 2wk2 and also RN for wound care. Jeanine states pt has some dressing changes and a sore on spine. According to discharge summary PT orders is in but no RN. Ok for RN for wound care?

## 2018-01-07 ENCOUNTER — Encounter: Payer: Self-pay | Admitting: Physical Medicine & Rehabilitation

## 2018-01-07 ENCOUNTER — Other Ambulatory Visit: Payer: Self-pay

## 2018-01-07 ENCOUNTER — Encounter: Payer: Medicare Other | Attending: Physical Medicine & Rehabilitation | Admitting: Physical Medicine & Rehabilitation

## 2018-01-07 VITALS — BP 146/87 | HR 107 | Ht 61.5 in | Wt 86.0 lb

## 2018-01-07 DIAGNOSIS — S32810S Multiple fractures of pelvis with stable disruption of pelvic ring, sequela: Secondary | ICD-10-CM | POA: Insufficient documentation

## 2018-01-07 DIAGNOSIS — F17200 Nicotine dependence, unspecified, uncomplicated: Secondary | ICD-10-CM | POA: Diagnosis not present

## 2018-01-07 DIAGNOSIS — Z79899 Other long term (current) drug therapy: Secondary | ICD-10-CM | POA: Insufficient documentation

## 2018-01-07 NOTE — Patient Instructions (Addendum)
TYLENOL: 326-650MG  EVERY 6 HOURS AS NEEDED UP TO 2700MG  PER DAY  ICE  HEAT

## 2018-01-07 NOTE — Telephone Encounter (Signed)
Jeanine notified

## 2018-01-07 NOTE — Telephone Encounter (Signed)
Please proceed.
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
## 2018-01-14 ENCOUNTER — Encounter: Payer: Medicare Other | Admitting: Physical Medicine & Rehabilitation

## 2018-01-29 DIAGNOSIS — Z9181 History of falling: Secondary | ICD-10-CM

## 2018-01-29 DIAGNOSIS — S72462D Displaced supracondylar fracture with intracondylar extension of lower end of left femur, subsequent encounter for closed fracture with routine healing: Secondary | ICD-10-CM | POA: Diagnosis not present

## 2018-01-29 DIAGNOSIS — S32810D Multiple fractures of pelvis with stable disruption of pelvic ring, subsequent encounter for fracture with routine healing: Secondary | ICD-10-CM | POA: Diagnosis not present

## 2018-01-29 DIAGNOSIS — M810 Age-related osteoporosis without current pathological fracture: Secondary | ICD-10-CM

## 2018-01-29 DIAGNOSIS — S42022D Displaced fracture of shaft of left clavicle, subsequent encounter for fracture with routine healing: Secondary | ICD-10-CM | POA: Diagnosis not present

## 2018-01-29 DIAGNOSIS — R030 Elevated blood-pressure reading, without diagnosis of hypertension: Secondary | ICD-10-CM

## 2018-01-29 DIAGNOSIS — F17210 Nicotine dependence, cigarettes, uncomplicated: Secondary | ICD-10-CM

## 2018-02-05 DIAGNOSIS — S42022D Displaced fracture of shaft of left clavicle, subsequent encounter for fracture with routine healing: Secondary | ICD-10-CM | POA: Diagnosis not present

## 2018-02-05 DIAGNOSIS — M810 Age-related osteoporosis without current pathological fracture: Secondary | ICD-10-CM | POA: Diagnosis not present

## 2018-02-05 DIAGNOSIS — S32810D Multiple fractures of pelvis with stable disruption of pelvic ring, subsequent encounter for fracture with routine healing: Secondary | ICD-10-CM | POA: Diagnosis not present

## 2018-02-05 DIAGNOSIS — Z9181 History of falling: Secondary | ICD-10-CM

## 2018-02-05 DIAGNOSIS — F17210 Nicotine dependence, cigarettes, uncomplicated: Secondary | ICD-10-CM

## 2018-02-05 DIAGNOSIS — S72462D Displaced supracondylar fracture with intracondylar extension of lower end of left femur, subsequent encounter for closed fracture with routine healing: Secondary | ICD-10-CM | POA: Diagnosis not present

## 2018-02-05 DIAGNOSIS — R030 Elevated blood-pressure reading, without diagnosis of hypertension: Secondary | ICD-10-CM

## 2018-11-29 IMAGING — DX DG PELVIS 3+V JUDET
3 series · 3 of 3 positions shown · non-contrast
Comparison: 12/08/2017

CLINICAL DATA: Postop.

EXAM:
JUDET PELVIS - 3+ VIEW

[pelvis ap]
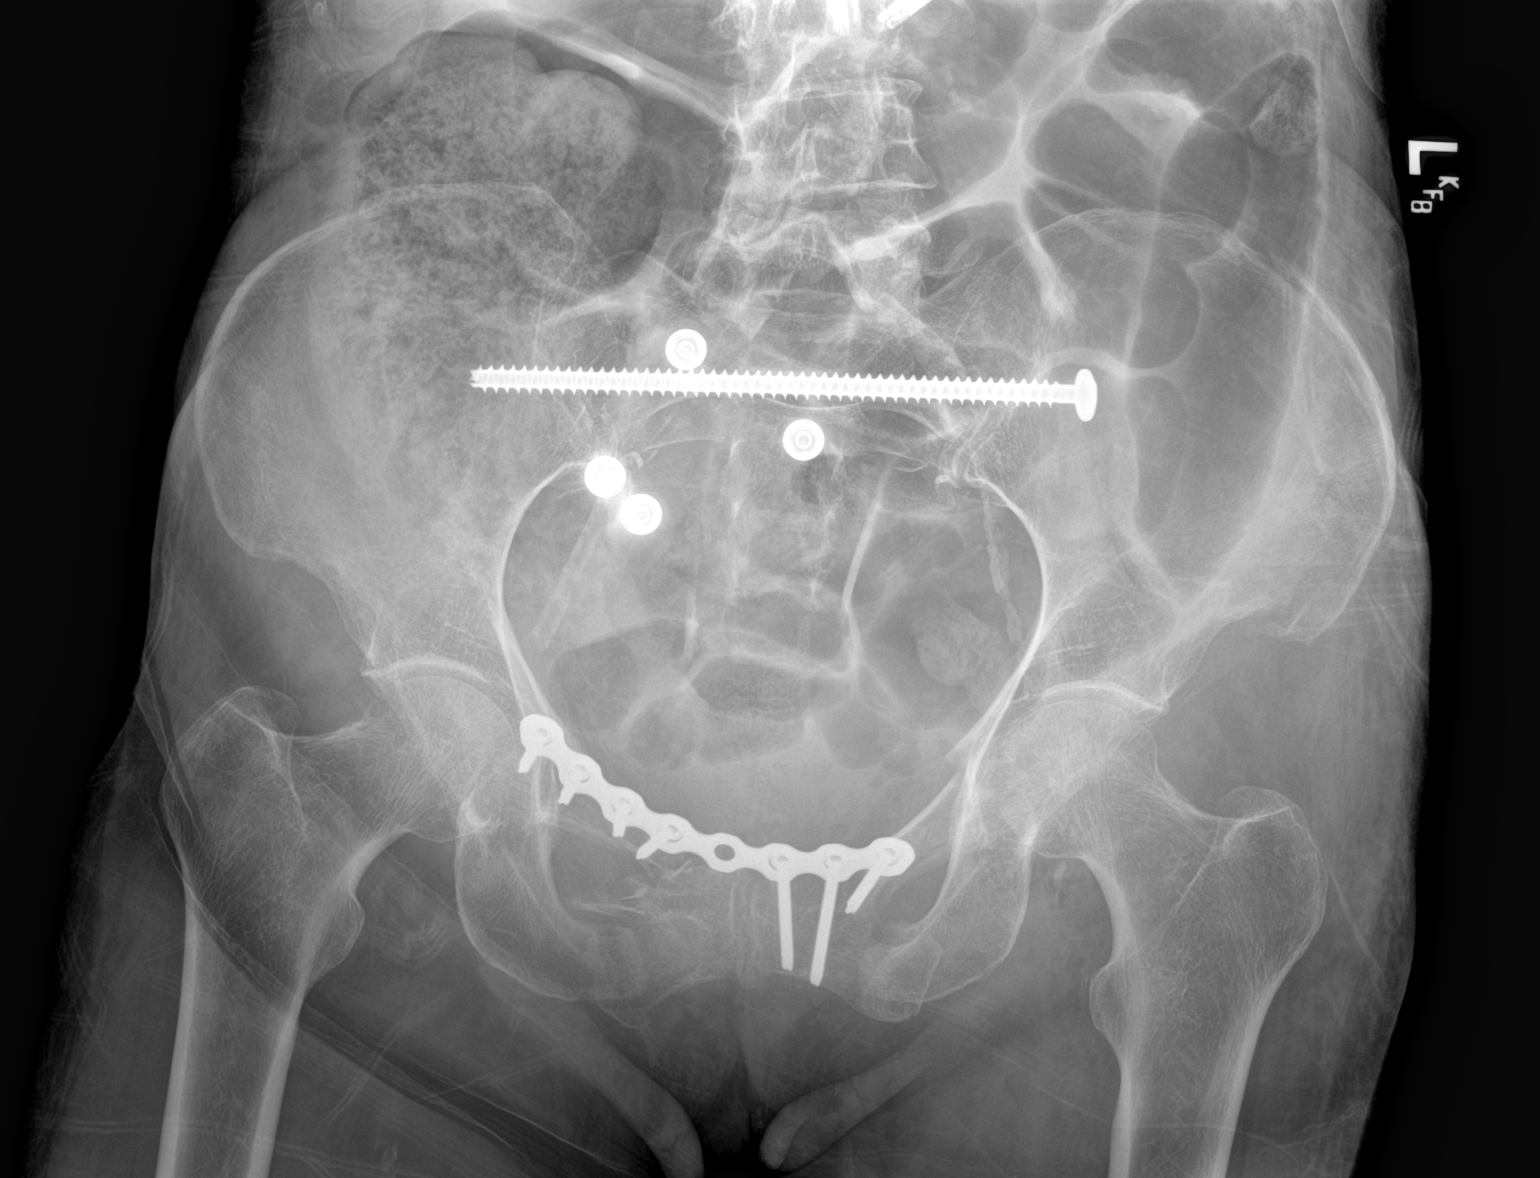

[pelvis obl (1 of 2)]
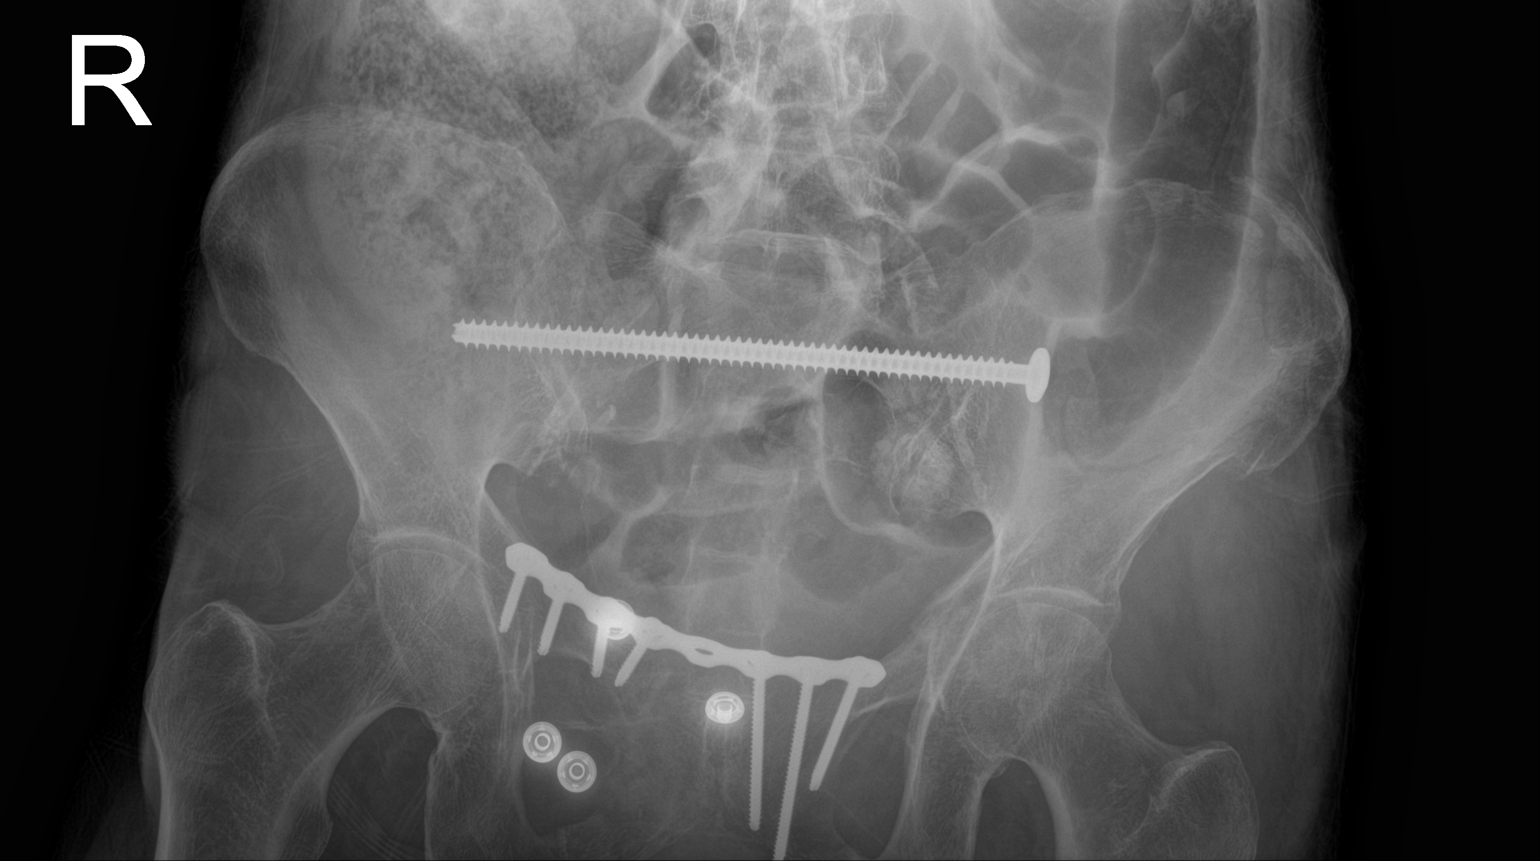

[pelvis obl (2 of 2)]
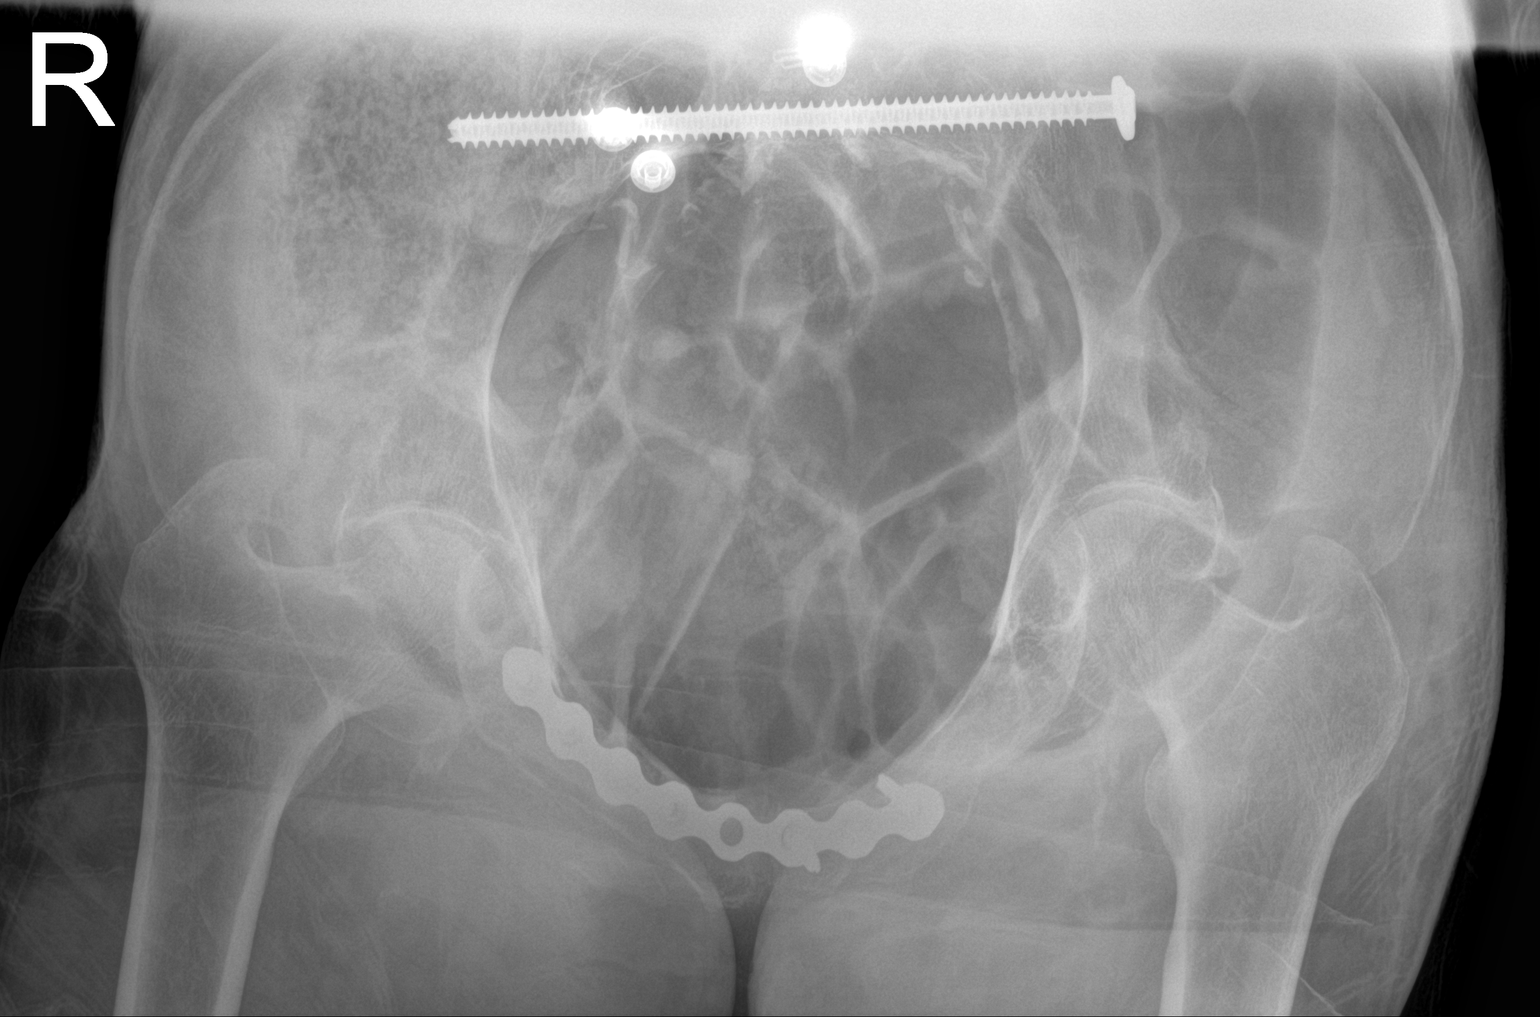

[3 of 3 positions shown; findings below may reference images not displayed]

FINDINGS: There is a single orthopedic screw extending from left to right
bridging the sacrum and sacroiliac joints. Fixation plate and screws
over the superior pubic rami bridging the symphysis fixating
patient's fractures. Bilateral inferior pubic rami fractures.
Remainder the exam is unchanged.
IMPRESSION: Fixation of pelvic fractures as described.

## 2018-11-30 IMAGING — DX DG ABD PORTABLE 1V
1 series · 2 of 2 positions shown · non-contrast
Comparison: 12/10/2017.

CLINICAL DATA: Abdominal distention.

EXAM:
PORTABLE ABDOMEN - 1 VIEW

[Series 1: abdomen · 0.14mm/px · 2 of 2 slices shown]
[im 1/2]
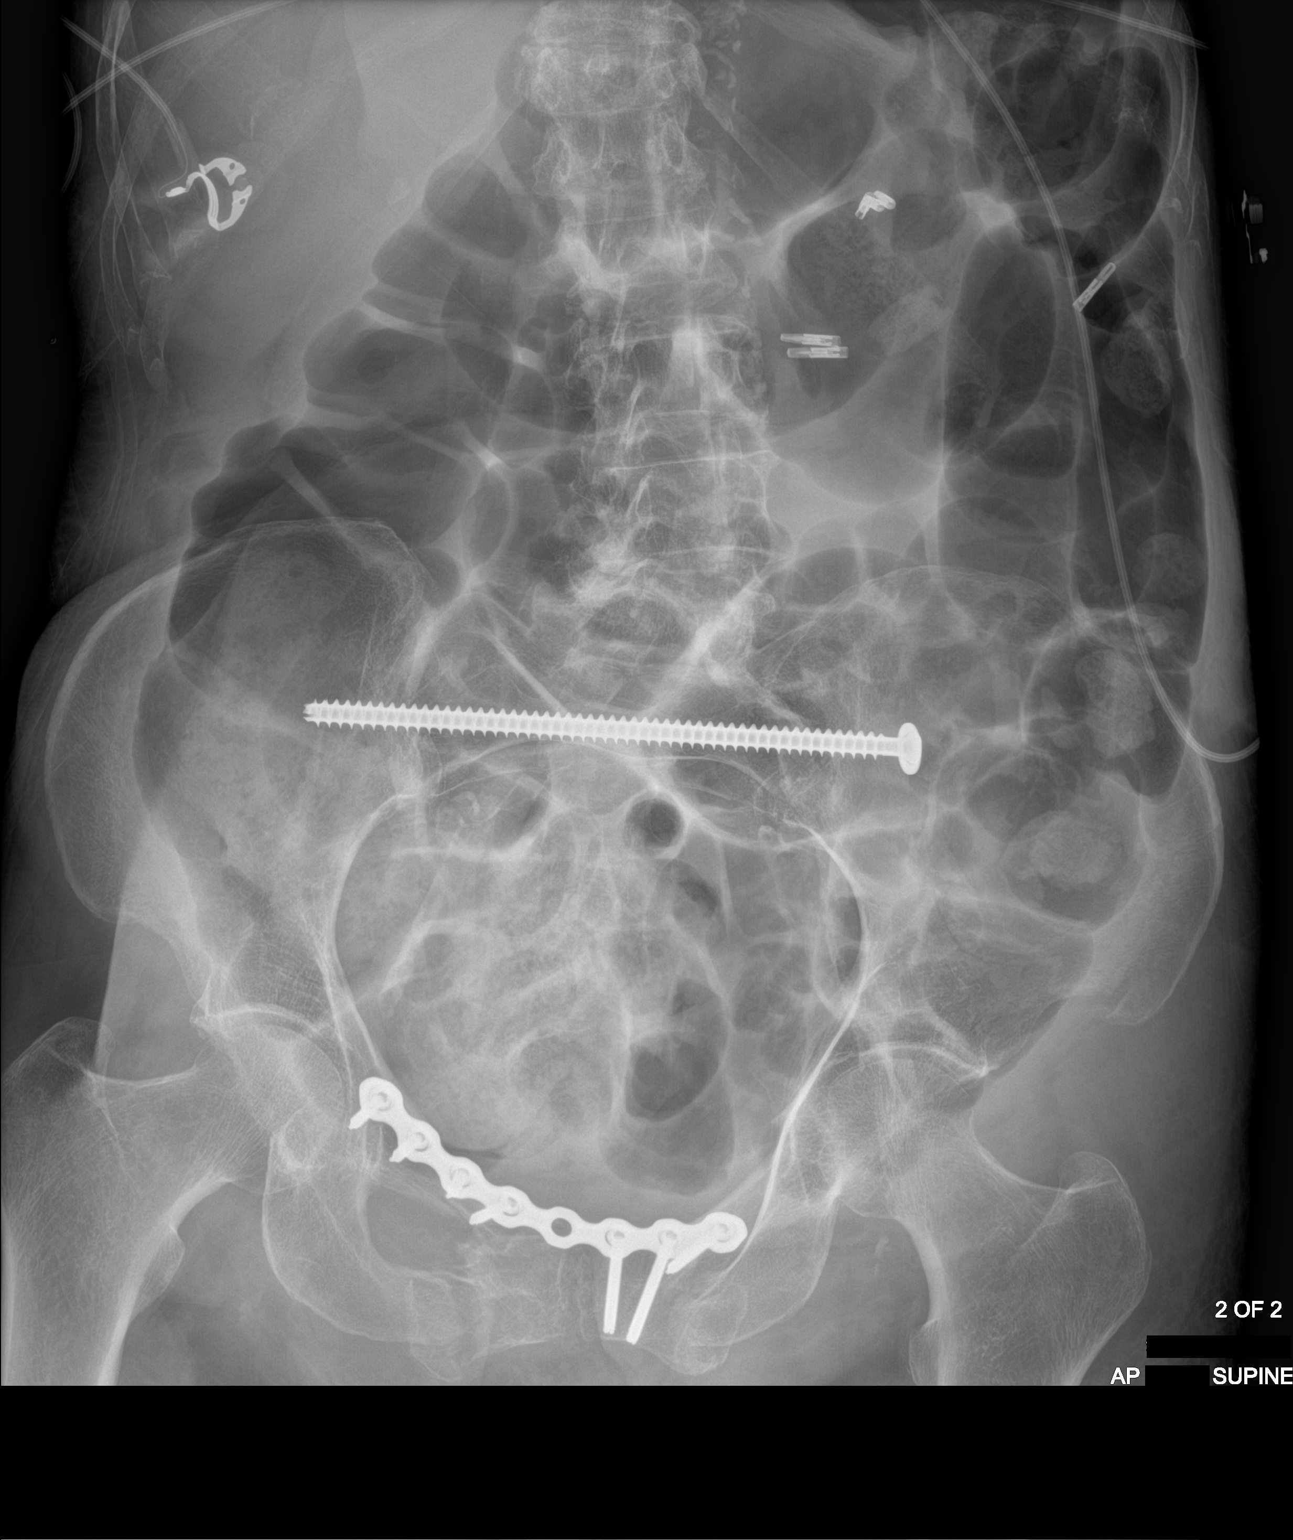
[im 2/2]
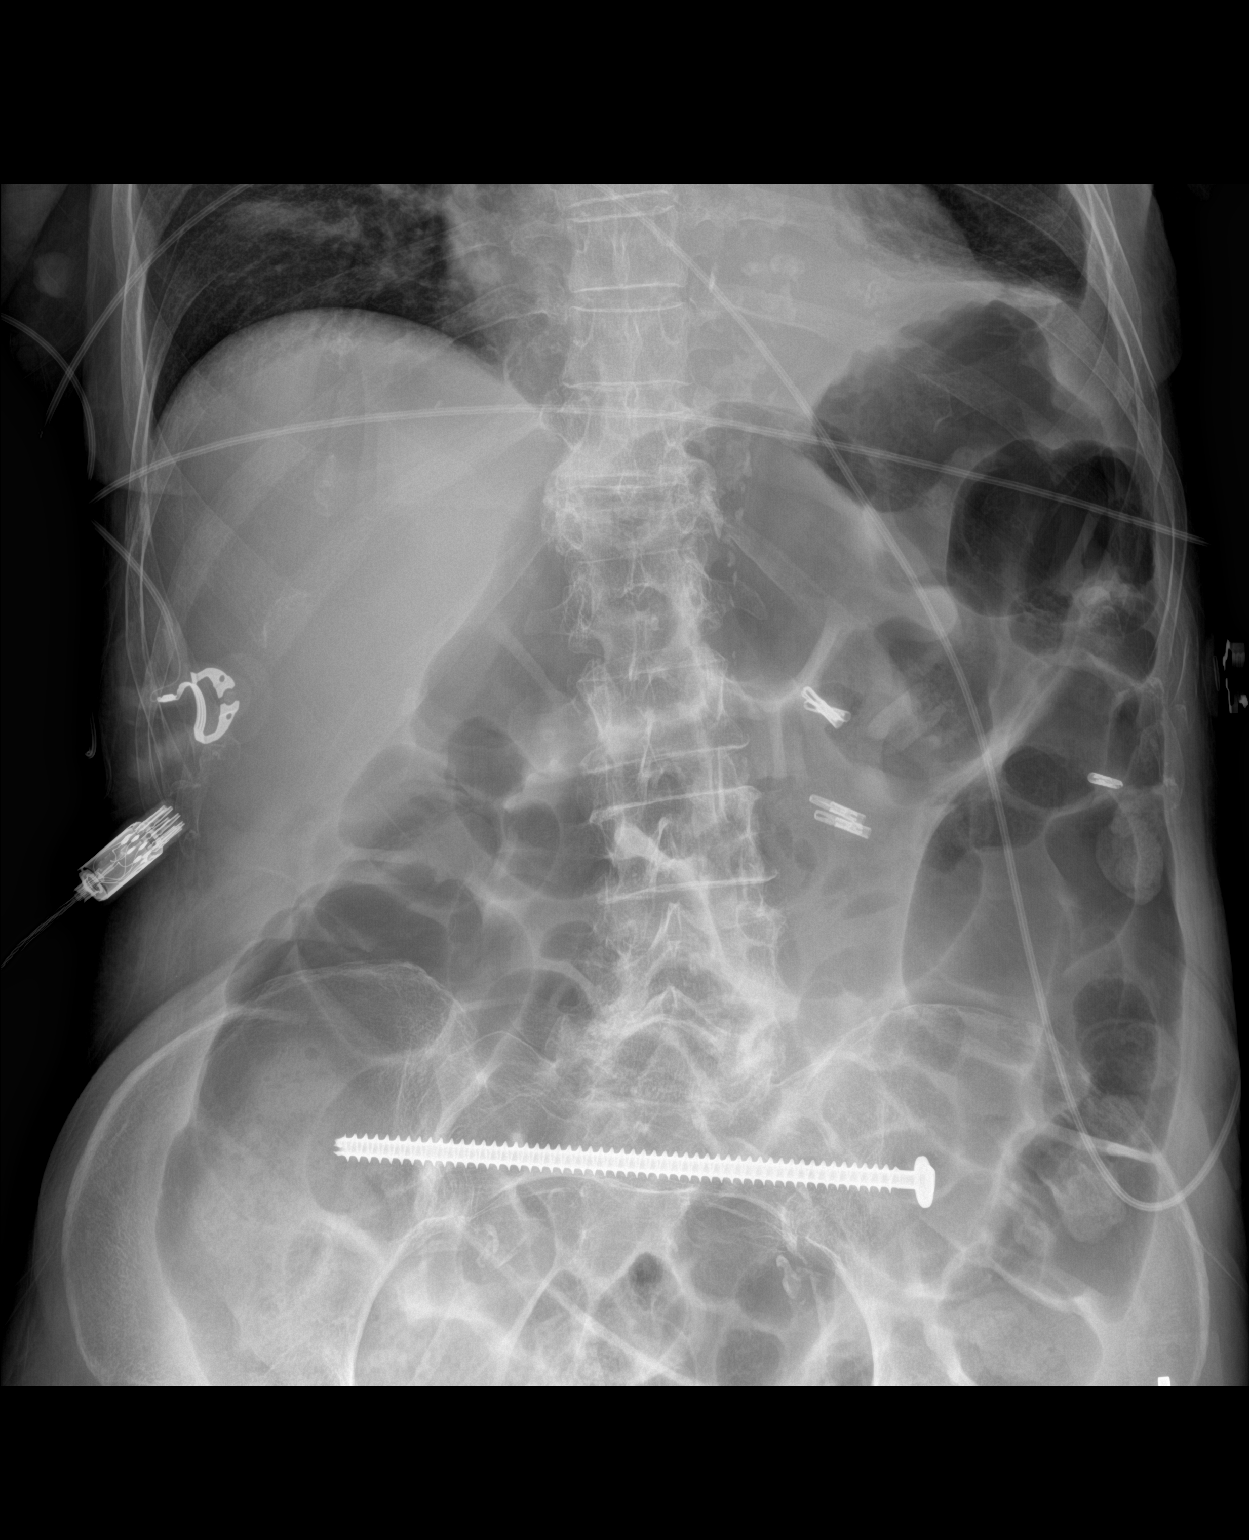

[2 of 2 positions shown; findings below may reference images not displayed]

FINDINGS: Distended loops of small and large bowel noted. Findings suggest
adynamic ileus. No free air identified. Metallic densities noted
over the left upper abdomen. Postsurgical changes are noted
throughout the pelvis. Diffuse osteopenia degenerative change.
Vascular calcification. Bibasilar atelectasis, particular prominent
left lung base.
IMPRESSION: 1. Distended loops of small and large bowel noted consistent with
adynamic ileus.

2.  Bibasilar atelectasis, particular prominent the left lung base.

## 2018-12-02 IMAGING — DX DG ABD PORTABLE 1V
1 series · 1 of 1 positions shown · non-contrast
Comparison: Radiographs December 11, 2017.

CLINICAL DATA: Ileus.

EXAM:
PORTABLE ABDOMEN - 1 VIEW

[abdomen]
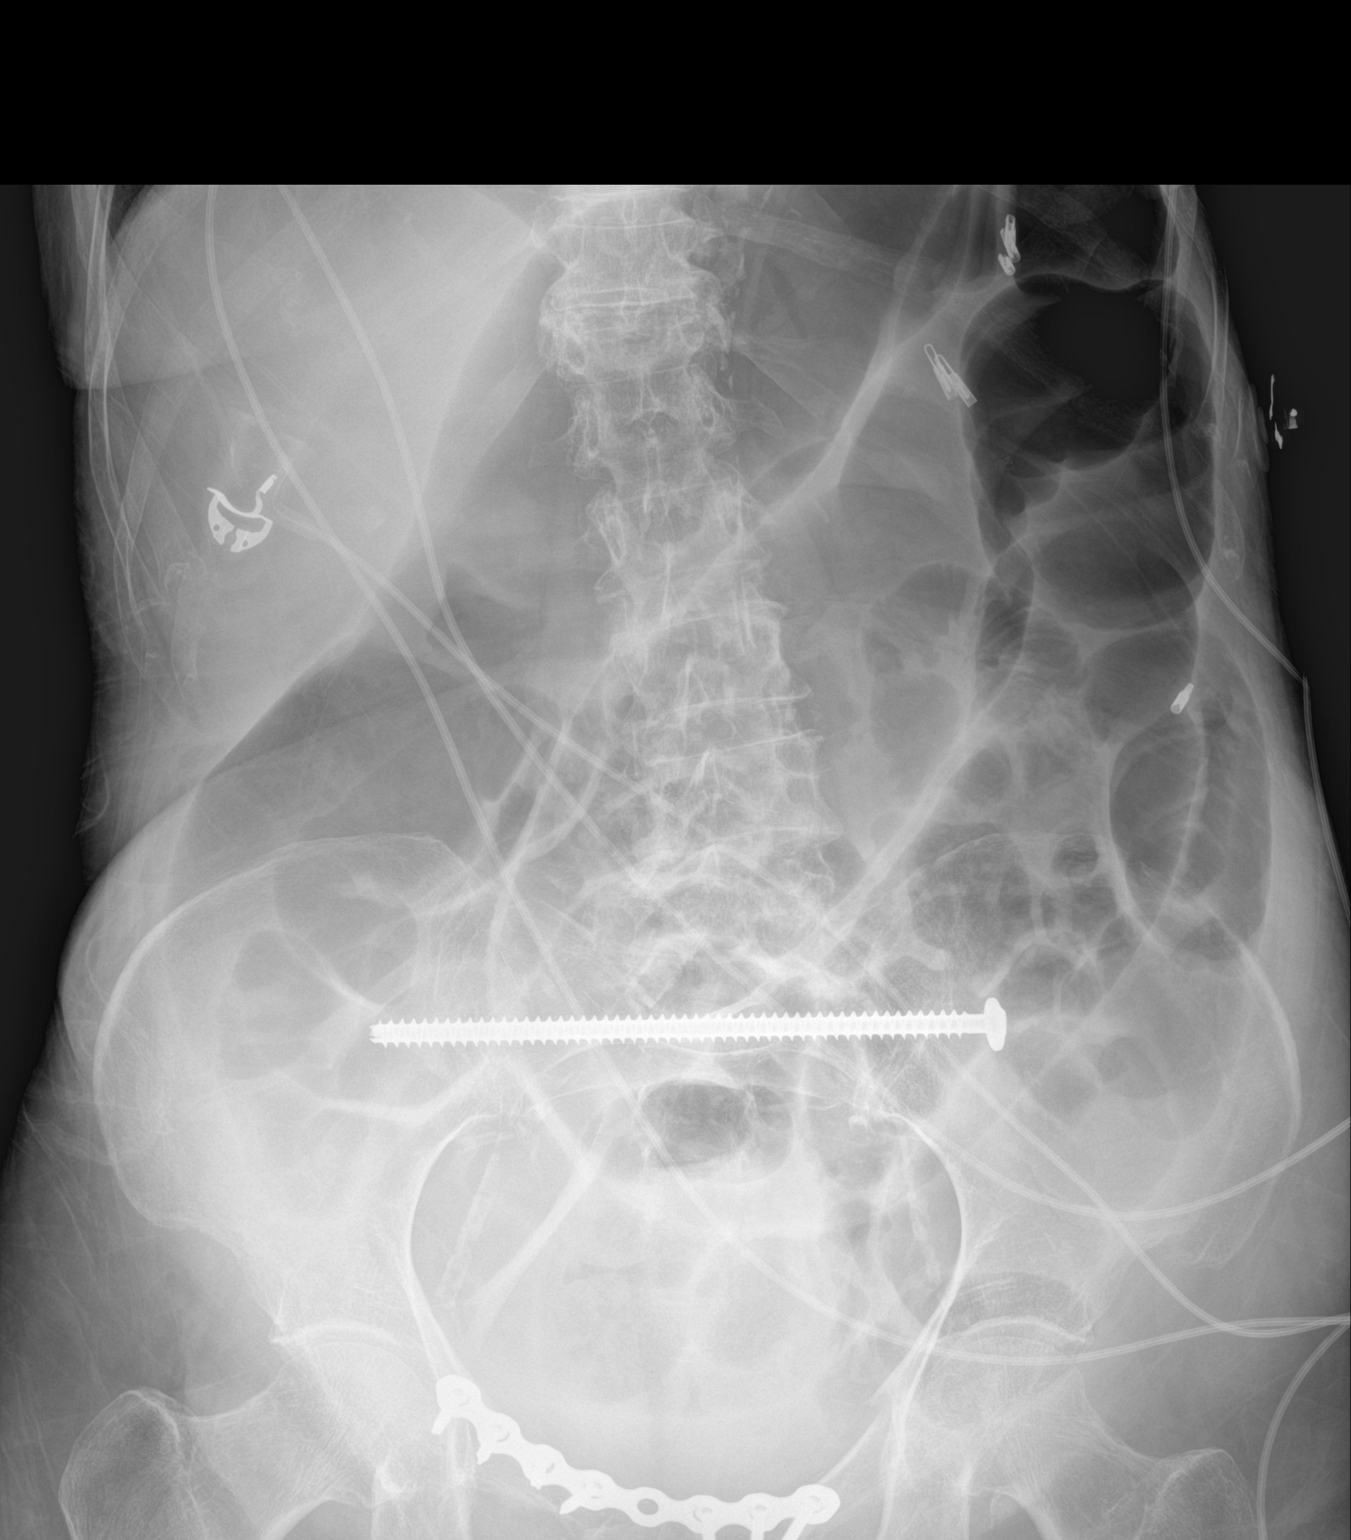

[1 of 1 positions shown; findings below may reference images not displayed]

FINDINGS: Stable appearance of air-filled large and small bowel loops.
Postsurgical changes are seen involving the pelvis.
IMPRESSION: Stable large and small bowel dilatation is noted most consistent
with ileus.
